# Patient Record
Sex: Female | Born: 1970 | Race: Black or African American | Hispanic: No | Marital: Married | Smoking: Never smoker
Health system: Southern US, Community
[De-identification: ages and names within clinical notes are randomized; demographics above are authoritative.]

## PROBLEM LIST (undated history)

## (undated) DIAGNOSIS — D219 Benign neoplasm of connective and other soft tissue, unspecified: Secondary | ICD-10-CM

## (undated) DIAGNOSIS — R519 Headache, unspecified: Secondary | ICD-10-CM

## (undated) DIAGNOSIS — D649 Anemia, unspecified: Secondary | ICD-10-CM

## (undated) DIAGNOSIS — N946 Dysmenorrhea, unspecified: Secondary | ICD-10-CM

## (undated) DIAGNOSIS — R51 Headache: Secondary | ICD-10-CM

## (undated) DIAGNOSIS — J40 Bronchitis, not specified as acute or chronic: Secondary | ICD-10-CM

## (undated) HISTORY — PX: CERVICAL CERCLAGE: SHX1329

---

## 1999-07-11 ENCOUNTER — Other Ambulatory Visit: Admission: RE | Admit: 1999-07-11 | Discharge: 1999-07-11 | Payer: Self-pay | Admitting: Obstetrics & Gynecology

## 1999-08-23 ENCOUNTER — Observation Stay (HOSPITAL_COMMUNITY): Admission: RE | Admit: 1999-08-23 | Discharge: 1999-08-23 | Payer: Self-pay | Admitting: Obstetrics & Gynecology

## 2000-01-23 ENCOUNTER — Inpatient Hospital Stay (HOSPITAL_COMMUNITY): Admission: AD | Admit: 2000-01-23 | Discharge: 2000-01-23 | Payer: Self-pay | Admitting: Obstetrics and Gynecology

## 2000-01-24 ENCOUNTER — Inpatient Hospital Stay (HOSPITAL_COMMUNITY): Admission: AD | Admit: 2000-01-24 | Discharge: 2000-01-26 | Payer: Self-pay | Admitting: Obstetrics & Gynecology

## 2000-12-12 ENCOUNTER — Ambulatory Visit (HOSPITAL_COMMUNITY): Admission: RE | Admit: 2000-12-12 | Discharge: 2000-12-12 | Payer: Self-pay | Admitting: Obstetrics & Gynecology

## 2001-10-21 ENCOUNTER — Encounter: Admission: RE | Admit: 2001-10-21 | Discharge: 2001-10-21 | Payer: Self-pay | Admitting: *Deleted

## 2002-06-02 ENCOUNTER — Emergency Department (HOSPITAL_COMMUNITY): Admission: EM | Admit: 2002-06-02 | Discharge: 2002-06-02 | Payer: Self-pay | Admitting: Emergency Medicine

## 2006-09-17 DIAGNOSIS — J40 Bronchitis, not specified as acute or chronic: Secondary | ICD-10-CM

## 2006-09-17 HISTORY — DX: Bronchitis, not specified as acute or chronic: J40

## 2012-08-22 ENCOUNTER — Encounter (HOSPITAL_BASED_OUTPATIENT_CLINIC_OR_DEPARTMENT_OTHER): Payer: Self-pay | Admitting: Emergency Medicine

## 2012-08-22 ENCOUNTER — Emergency Department (HOSPITAL_BASED_OUTPATIENT_CLINIC_OR_DEPARTMENT_OTHER)
Admission: EM | Admit: 2012-08-22 | Discharge: 2012-08-22 | Disposition: A | Discharge status: Home / Self Care | Payer: BC Managed Care – PPO | Attending: Emergency Medicine | Admitting: Emergency Medicine

## 2012-08-22 ENCOUNTER — Emergency Department (HOSPITAL_BASED_OUTPATIENT_CLINIC_OR_DEPARTMENT_OTHER): Payer: BC Managed Care – PPO

## 2012-08-22 DIAGNOSIS — J069 Acute upper respiratory infection, unspecified: Secondary | ICD-10-CM | POA: Insufficient documentation

## 2012-08-22 DIAGNOSIS — Z79899 Other long term (current) drug therapy: Secondary | ICD-10-CM | POA: Insufficient documentation

## 2012-08-22 MED ORDER — ALBUTEROL SULFATE HFA 108 (90 BASE) MCG/ACT IN AERS
2.0000 | INHALATION_SPRAY | Freq: Once | RESPIRATORY_TRACT | Status: AC
  Administered 2012-08-22: 2 via RESPIRATORY_TRACT
  Filled 2012-08-22: qty 6.7

## 2012-08-22 NOTE — ED Provider Notes (Signed)
History     CSN: [id number]  Arrival date & time 08/22/12  4098   First MD Initiated Contact with Patient 08/22/12 0348      Chief Complaint  Patient presents with  . Cough    (Consider location/radiation/quality/duration/timing/severity/associated sxs/prior treatment) HPI This 41 year old female has a few days of a nonproductive cough, she did have nasal congestion as well but the nasal congestion is now resolved, she is no sore throat, she is no fever, she is no chest pain, she is not short of breath but she is not able to sleep tonight due to the cough, she has no abdominal pain, she is no vomiting diarrhea rash, she was seen at an urgent care within the last couple days and diagnosed with a viral upper respiratory infection, there is no treatment prior to arrival. Her cough is moderately severe at times. History reviewed. No pertinent past medical history.  History reviewed. No pertinent past surgical history.  History reviewed. No pertinent family history.  History  Substance Use Topics  . Smoking status: Never Smoker   . Smokeless tobacco: Not on file  . Alcohol Use: No    OB History    Grav Para Term Preterm Abortions TAB SAB Ect Mult Living                  Review of Systems 10 Systems reviewed and are negative for acute change except as noted in the HPI. Allergies  Sulfa antibiotics  Home Medications   Current Outpatient Rx  Name  Route  Sig  Dispense  Refill  . IBUPROFEN 200 MG PO TABS   Oral   Take 200 mg by mouth every 6 (six) hours as needed.         Marland Kitchen PHENYLEPHRINE HCL 10 MG PO TABS   Oral   Take 10 mg by mouth every 4 (four) hours as needed.           BP 114/59  Pulse 74  Temp 98.6 F (37 C) (Oral)  Resp 16  SpO2 99%  LMP 08/11/2012  Physical Exam  Nursing note and vitals reviewed. Constitutional:       Awake, alert, nontoxic appearance.  HENT:  Head: Atraumatic.  Eyes: Right eye exhibits no discharge. Left eye exhibits no  discharge.  Neck: Neck supple.  Cardiovascular: Normal rate and regular rhythm.   No murmur heard. Pulmonary/Chest: Effort normal and breath sounds normal. No respiratory distress. She has no wheezes. She has no rales. She exhibits no tenderness.  Abdominal: Soft. Bowel sounds are normal. She exhibits no distension and no mass. There is no tenderness. There is no rebound and no guarding.  Musculoskeletal: She exhibits no tenderness.       Baseline ROM, no obvious new focal weakness.  Neurological: She is alert.       Mental status and motor strength appears baseline for patient and situation.  Skin: No rash noted.  Psychiatric: She has a normal mood and affect.    ED Course  Procedures (including critical care time)  Labs Reviewed - No data to display Dg Chest 2 View  08/22/2012  *RADIOLOGY REPORT*  Clinical Data: Cough and congestion.  CHEST - 2 VIEW  Comparison: None  Findings: The lungs are well-aerated and clear.  There is no evidence of focal opacification, pleural effusion or pneumothorax.  The heart is normal in size; the mediastinal contour is within normal limits.  No acute osseous abnormalities are seen.  IMPRESSION: No acute cardiopulmonary  process seen.   Original Report Authenticated By: Tonia Ghent, M.D.      1. URI (upper respiratory infection)       MDM  Patient / Family / Caregiver informed of clinical course, understand medical decision-making process, and agree with plan.  I doubt any other EMC precluding discharge at this time including, but not necessarily limited to the following:SBI.           Hurman Horn, MD 08/22/12 [phone]

## 2012-08-22 NOTE — ED Notes (Signed)
Pt recently dx with upper respiratory infection at urgent care tues, pt developed severe cough yesterday, unable to sleep,

## 2015-12-06 ENCOUNTER — Emergency Department (HOSPITAL_BASED_OUTPATIENT_CLINIC_OR_DEPARTMENT_OTHER): Payer: BLUE CROSS/BLUE SHIELD

## 2015-12-06 ENCOUNTER — Emergency Department (HOSPITAL_BASED_OUTPATIENT_CLINIC_OR_DEPARTMENT_OTHER)
Admission: EM | Admit: 2015-12-06 | Discharge: 2015-12-06 | Disposition: A | Discharge status: Home / Self Care | Payer: BLUE CROSS/BLUE SHIELD | Attending: Emergency Medicine | Admitting: Emergency Medicine

## 2015-12-06 ENCOUNTER — Encounter (HOSPITAL_BASED_OUTPATIENT_CLINIC_OR_DEPARTMENT_OTHER): Payer: Self-pay | Admitting: *Deleted

## 2015-12-06 DIAGNOSIS — Z8742 Personal history of other diseases of the female genital tract: Secondary | ICD-10-CM | POA: Insufficient documentation

## 2015-12-06 DIAGNOSIS — R1031 Right lower quadrant pain: Secondary | ICD-10-CM | POA: Insufficient documentation

## 2015-12-06 DIAGNOSIS — Z86018 Personal history of other benign neoplasm: Secondary | ICD-10-CM | POA: Diagnosis not present

## 2015-12-06 DIAGNOSIS — D509 Iron deficiency anemia, unspecified: Secondary | ICD-10-CM | POA: Diagnosis not present

## 2015-12-06 DIAGNOSIS — R197 Diarrhea, unspecified: Secondary | ICD-10-CM | POA: Diagnosis not present

## 2015-12-06 DIAGNOSIS — Z3202 Encounter for pregnancy test, result negative: Secondary | ICD-10-CM | POA: Insufficient documentation

## 2015-12-06 DIAGNOSIS — R109 Unspecified abdominal pain: Secondary | ICD-10-CM | POA: Diagnosis present

## 2015-12-06 HISTORY — DX: Anemia, unspecified: D64.9

## 2015-12-06 HISTORY — DX: Benign neoplasm of connective and other soft tissue, unspecified: D21.9

## 2015-12-06 HISTORY — DX: Dysmenorrhea, unspecified: N94.6

## 2015-12-06 LAB — URINALYSIS, ROUTINE W REFLEX MICROSCOPIC
Bilirubin Urine: NEGATIVE
Bilirubin Urine: NEGATIVE
GLUCOSE, UA: NEGATIVE mg/dL
Glucose, UA: NEGATIVE mg/dL
KETONES UR: NEGATIVE mg/dL
Ketones, ur: NEGATIVE mg/dL
Nitrite: NEGATIVE
Nitrite: NEGATIVE
PROTEIN: 100 mg/dL — AB
PROTEIN: NEGATIVE mg/dL
SPECIFIC GRAVITY, URINE: 1.003 — AB (ref 1.005–1.030)
Specific Gravity, Urine: 1.037 — ABNORMAL HIGH (ref 1.005–1.030)
pH: 5 (ref 5.0–8.0)
pH: 6 (ref 5.0–8.0)

## 2015-12-06 LAB — DIFFERENTIAL
BASOS ABS: 0 10*3/uL (ref 0.0–0.1)
BASOS PCT: 0 %
EOS PCT: 3 %
Eosinophils Absolute: 0.4 10*3/uL (ref 0.0–0.7)
LYMPHS ABS: 1 10*3/uL (ref 0.7–4.0)
Lymphocytes Relative: 7 %
MONO ABS: 0.4 10*3/uL (ref 0.1–1.0)
Monocytes Relative: 3 %
NEUTROS ABS: 11.3 10*3/uL — AB (ref 1.7–7.7)
Neutrophils Relative %: 87 %

## 2015-12-06 LAB — LIPASE, BLOOD: LIPASE: 37 U/L (ref 11–51)

## 2015-12-06 LAB — COMPREHENSIVE METABOLIC PANEL
ALK PHOS: 52 U/L (ref 38–126)
ALT: 13 U/L — AB (ref 14–54)
AST: 22 U/L (ref 15–41)
Albumin: 3.9 g/dL (ref 3.5–5.0)
Anion gap: 8 (ref 5–15)
BILIRUBIN TOTAL: 0.5 mg/dL (ref 0.3–1.2)
BUN: 15 mg/dL (ref 6–20)
CALCIUM: 9 mg/dL (ref 8.9–10.3)
CO2: 21 mmol/L — ABNORMAL LOW (ref 22–32)
CREATININE: 0.63 mg/dL (ref 0.44–1.00)
Chloride: 106 mmol/L (ref 101–111)
Glucose, Bld: 131 mg/dL — ABNORMAL HIGH (ref 65–99)
Potassium: 3.7 mmol/L (ref 3.5–5.1)
Sodium: 135 mmol/L (ref 135–145)
TOTAL PROTEIN: 7.1 g/dL (ref 6.5–8.1)

## 2015-12-06 LAB — CBC
HCT: 27 % — ABNORMAL LOW (ref 36.0–46.0)
Hemoglobin: 7.7 g/dL — ABNORMAL LOW (ref 12.0–15.0)
MCH: 19.8 pg — ABNORMAL LOW (ref 26.0–34.0)
MCHC: 28.5 g/dL — ABNORMAL LOW (ref 30.0–36.0)
MCV: 69.4 fL — AB (ref 78.0–100.0)
PLATELETS: 371 10*3/uL (ref 150–400)
RBC: 3.89 MIL/uL (ref 3.87–5.11)
RDW: 17.8 % — AB (ref 11.5–15.5)
WBC: 12.5 10*3/uL — AB (ref 4.0–10.5)

## 2015-12-06 LAB — URINE MICROSCOPIC-ADD ON

## 2015-12-06 LAB — PREGNANCY, URINE: PREG TEST UR: NEGATIVE

## 2015-12-06 MED ORDER — FERROUS SULFATE 325 (65 FE) MG PO TABS: 325.0000 mg | ORAL_TABLET | Freq: Three times a day (TID) | ORAL | Status: AC

## 2015-12-06 MED ORDER — SODIUM CHLORIDE 0.9 % IV BOLUS (SEPSIS)
1000.0000 mL | Freq: Once | INTRAVENOUS | Status: AC
  Administered 2015-12-06: 1000 mL via INTRAVENOUS

## 2015-12-06 MED ORDER — ONDANSETRON HCL 4 MG/2ML IJ SOLN
4.0000 mg | Freq: Once | INTRAMUSCULAR | Status: AC
  Administered 2015-12-06: 4 mg via INTRAVENOUS
  Filled 2015-12-06: qty 2

## 2015-12-06 MED ORDER — IOHEXOL 300 MG/ML  SOLN
100.0000 mL | Freq: Once | INTRAMUSCULAR | Status: AC | PRN
  Administered 2015-12-06: 100 mL via INTRAVENOUS

## 2015-12-06 MED ORDER — FENTANYL CITRATE (PF) 100 MCG/2ML IJ SOLN
100.0000 ug | Freq: Once | INTRAMUSCULAR | Status: AC
  Administered 2015-12-06: 100 ug via INTRAVENOUS
  Filled 2015-12-06: qty 2

## 2015-12-06 NOTE — ED Provider Notes (Signed)
CSN: [id number]     Arrival date & time 12/06/15  0112 History   First MD Initiated Contact with Patient 12/06/15 [phone]     Chief Complaint  Patient presents with  . Abdominal Pain     (Consider location/radiation/quality/duration/timing/severity/associated sxs/prior Treatment) HPI  This is a 45 year old female who developed right lower quadrant abdominal pain yesterday. She describes the pain as sharp. It is worse with movement or palpation. She rates it as an 8 out of 10. She is also currently on her menses and is having diarrhea. Diarrhea often accompanies her menses. She had nausea earlier but none now. She is having menstrual cramps consistent with previous dysmenorrhea. She is afebrile.  Past Medical History  Diagnosis Date  . Fibroid tumor   . Dysmenorrhea   . Anemia    History reviewed. No pertinent past surgical history. History reviewed. No pertinent family history. Social History  Substance Use Topics  . Smoking status: Never Smoker   . Smokeless tobacco: None  . Alcohol Use: No   OB History    No data available     Review of Systems  All other systems reviewed and are negative.   Allergies  Sulfa antibiotics  Home Medications   Prior to Admission medications   Medication Sig Start Date End Date Taking? Authorizing Provider  ibuprofen (ADVIL,MOTRIN) 200 MG tablet Take 200 mg by mouth every 6 (six) hours as needed.    Historical Provider, MD  phenylephrine (SUDAFED PE) 10 MG TABS Take 10 mg by mouth every 4 (four) hours as needed.    Historical Provider, MD   BP 121/58 mmHg  Pulse 74  Temp(Src) 97.7 F (36.5 C) (Oral)  Resp 18  Ht 5' 3.25" (1.607 m)  Wt 204 lb (92.534 kg)  BMI 35.83 kg/m2  SpO2 98%  LMP 12/06/2015   Physical Exam General: Well-developed, well-nourished female in no acute distress; appearance consistent with age of record HENT: normocephalic; atraumatic Eyes: pupils equal, round and reactive to light; extraocular muscles  intact Neck: supple Heart: regular rate and rhythm Lungs: clear to auscultation bilaterally Abdomen: soft; nondistended; right lower quadrant tenderness; no masses or hepatosplenomegaly; bowel sounds present Extremities: No deformity; full range of motion; pulses normal Neurologic: Awake, alert and oriented; motor function intact in all extremities and symmetric; no facial droop Skin: Warm and dry Psychiatric: Normal mood and affect    ED Course  Procedures (including critical care time)   MDM   Nursing notes and vitals signs, including pulse oximetry, reviewed.  Summary of this visit's results, reviewed by myself:  Labs:  Results for orders placed or performed during the hospital encounter of 12/06/15 (from the past 24 hour(s))  Lipase, blood     Status: None   Collection Time: 12/06/15  1:45 AM  Result Value Ref Range   Lipase 37 11 - 51 U/L  Comprehensive metabolic panel     Status: Abnormal   Collection Time: 12/06/15  1:45 AM  Result Value Ref Range   Sodium 135 135 - 145 mmol/L   Potassium 3.7 3.5 - 5.1 mmol/L   Chloride 106 101 - 111 mmol/L   CO2 21 (L) 22 - 32 mmol/L   Glucose, Bld 131 (H) 65 - 99 mg/dL   BUN 15 6 - 20 mg/dL   Creatinine, Ser 0.63 0.44 - 1.00 mg/dL   Calcium 9.0 8.9 - 10.3 mg/dL   Total Protein 7.1 6.5 - 8.1 g/dL   Albumin 3.9 3.5 - 5.0 g/dL  AST 22 15 - 41 U/L   ALT 13 (L) 14 - 54 U/L   Alkaline Phosphatase 52 38 - 126 U/L   Total Bilirubin 0.5 0.3 - 1.2 mg/dL   GFR calc non Af Amer >60 >60 mL/min   GFR calc Af Amer >60 >60 mL/min   Anion gap 8 5 - 15  CBC     Status: Abnormal   Collection Time: 12/06/15  1:45 AM  Result Value Ref Range   WBC 12.5 (H) 4.0 - 10.5 K/uL   RBC 3.89 3.87 - 5.11 MIL/uL   Hemoglobin 7.7 (L) 12.0 - 15.0 g/dL   HCT 27.0 (L) 36.0 - 46.0 %   MCV 69.4 (L) 78.0 - 100.0 fL   MCH 19.8 (L) 26.0 - 34.0 pg   MCHC 28.5 (L) 30.0 - 36.0 g/dL   RDW 17.8 (H) 11.5 - 15.5 %   Platelets 371 150 - 400 K/uL  Differential      Status: Abnormal   Collection Time: 12/06/15  1:45 AM  Result Value Ref Range   Neutro Abs 11.3 (H) 1.7 - 7.7 K/uL   Lymphs Abs 1.0 0.7 - 4.0 K/uL   Monocytes Absolute 0.4 0.1 - 1.0 K/uL   Eosinophils Absolute 0.4 0.0 - 0.7 K/uL   Basophils Absolute 0.0 0.0 - 0.1 K/uL   Neutrophils Relative % 87 %   Lymphocytes Relative 7 %   Monocytes Relative 3 %   Eosinophils Relative 3 %   Basophils Relative 0 %   RBC Morphology ELLIPTOCYTES    Smear Review LARGE PLATELETS PRESENT   Urinalysis, Routine w reflex microscopic (not at [facility])     Status: Abnormal   Collection Time: 12/06/15  2:20 AM  Result Value Ref Range   Color, Urine RED (A) YELLOW   APPearance CLOUDY (A) CLEAR   Specific Gravity, Urine 1.037 (H) 1.005 - 1.030   pH 5.0 5.0 - 8.0   Glucose, UA NEGATIVE NEGATIVE mg/dL   Hgb urine dipstick LARGE (A) NEGATIVE   Bilirubin Urine NEGATIVE NEGATIVE   Ketones, ur NEGATIVE NEGATIVE mg/dL   Protein, ur 100 (A) NEGATIVE mg/dL   Nitrite NEGATIVE NEGATIVE   Leukocytes, UA SMALL (A) NEGATIVE  Pregnancy, urine     Status: None   Collection Time: 12/06/15  2:20 AM  Result Value Ref Range   Preg Test, Ur NEGATIVE NEGATIVE  Urine microscopic-add on     Status: Abnormal   Collection Time: 12/06/15  2:20 AM  Result Value Ref Range   Squamous Epithelial / LPF 0-5 (A) NONE SEEN   WBC, UA 6-30 0 - 5 WBC/hpf   RBC / HPF TOO NUMEROUS TO COUNT 0 - 5 RBC/hpf   Bacteria, UA FEW (A) NONE SEEN   Urine-Other URINALYSIS PERFORMED ON SUPERNATANT   Urinalysis, Routine w reflex microscopic (not at [facility])     Status: Abnormal   Collection Time: 12/06/15  3:50 AM  Result Value Ref Range   Color, Urine YELLOW YELLOW   APPearance CLEAR CLEAR   Specific Gravity, Urine 1.003 (L) 1.005 - 1.030   pH 6.0 5.0 - 8.0   Glucose, UA NEGATIVE NEGATIVE mg/dL   Hgb urine dipstick LARGE (A) NEGATIVE   Bilirubin Urine NEGATIVE NEGATIVE   Ketones, ur NEGATIVE NEGATIVE mg/dL   Protein, ur NEGATIVE NEGATIVE mg/dL    Nitrite NEGATIVE NEGATIVE   Leukocytes, UA MODERATE (A) NEGATIVE  Urine microscopic-add on     Status: Abnormal   Collection Time: 12/06/15  3:50  AM  Result Value Ref Range   Squamous Epithelial / LPF 0-5 (A) NONE SEEN   WBC, UA 6-30 0 - 5 WBC/hpf   RBC / HPF 0-5 0 - 5 RBC/hpf   Bacteria, UA RARE (A) NONE SEEN    Imaging Studies: Ct Abdomen Pelvis W Contrast  12/06/2015  CLINICAL DATA:  Severe right lower quadrant pain of 6 hours duration. EXAM: CT ABDOMEN AND PELVIS WITH CONTRAST TECHNIQUE: Multidetector CT imaging of the abdomen and pelvis was performed using the standard protocol following bolus administration of intravenous contrast. CONTRAST:  187mL OMNIPAQUE IOHEXOL 300 MG/ML  SOLN COMPARISON:  None. FINDINGS: Lower chest:  No significant abnormality Hepatobiliary: There are normal appearances of the liver, gallbladder and bile ducts. Pancreas: Normal Spleen: Normal Adrenals/Urinary Tract: There are lower pole collecting system calculi bilaterally, 2 mm. Otherwise normal kidneys. Ureters and urinary bladder are unremarkable. Adrenals are normal. Stomach/Bowel: There are normal appearances of the stomach, small bowel and colon. The appendix is normal. Vascular/Lymphatic: The abdominal aorta is normal in caliber. There is no atherosclerotic calcification. There is no adenopathy in the abdomen or pelvis. Reproductive: Marked enlargement of the uterus with multiple fibroids measuring up to 6 cm. Uterine dimensions are 7.8 x 10.6 x 14.1 cm. Other: No acute inflammatory changes are evident in the abdomen or pelvis. There is no ascites. Musculoskeletal: No significant skeletal lesions. IMPRESSION: 1. No acute findings are evident in the abdomen or pelvis. Normal appendix. 2. Enlarged multi fibroid uterus. 3. Bilateral nephrolithiasis. Electronically Signed   By: Andreas Newport M.D.   On: 12/06/2015 05:45   Patient advised to CT findings. Urine sent for culture.    Shanon Rosser, MD 12/06/15 [phone]

## 2015-12-06 NOTE — ED Notes (Signed)
Alice in to talk with pt about drinking contrast.

## 2015-12-06 NOTE — ED Notes (Signed)
Returned from CT scan.

## 2015-12-06 NOTE — ED Notes (Signed)
Pt states she has had diarrhea all day. On menses now, "but not menses pain". Also c/o RLQ pain. Describes as sharp.

## 2015-12-06 NOTE — Discharge Instructions (Signed)

## 2015-12-06 NOTE — ED Notes (Signed)
Pt given d/c instructions as per chart. Rx x 1. Verbalizes understanding. No questions. 

## 2015-12-06 NOTE — ED Notes (Signed)
Continuing to drink contrast.

## 2015-12-07 LAB — URINE CULTURE

## 2017-01-01 IMAGING — CT CT ABD-PELV W/ CM
2 of 5 series · 16 of 46 positions shown, 18 images · IV contrast (APPLIED)
Comparison: None.

CLINICAL DATA: Severe right lower quadrant pain of 6 hours
duration.

EXAM:
CT ABDOMEN AND PELVIS WITH CONTRAST
TECHNIQUE: Multidetector CT imaging of the abdomen and pelvis was performed
using the standard protocol following bolus administration of
intravenous contrast.
CONTRAST:  100mL OMNIPAQUE IOHEXOL 300 MG/ML  SOLN

[Series 2: axial st · axial · 0.87mm/px · z∈[-492,-72]mm · 13 of 94 slices shown, 15 images]
[im 5/94  soft-tissue]
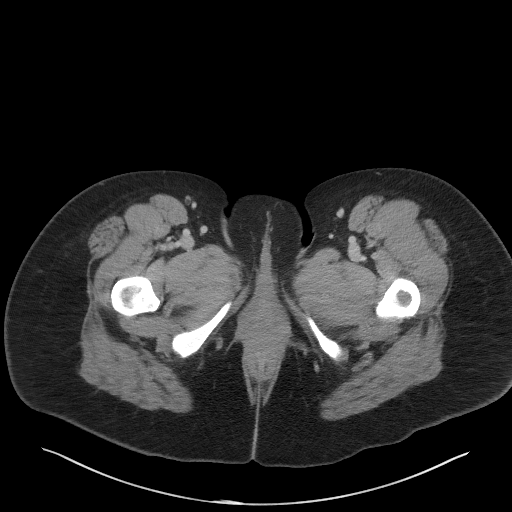
[im 5/94  bone]
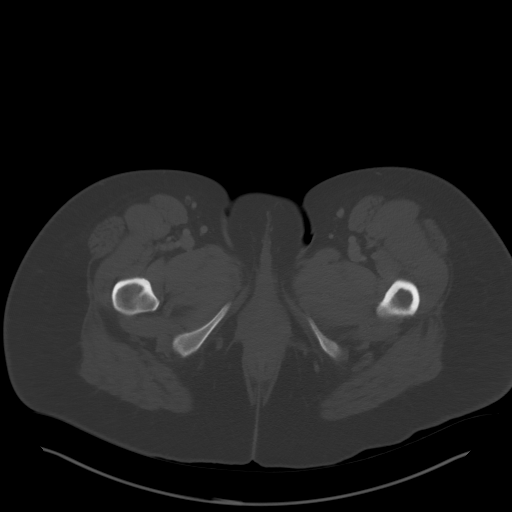
[im 15/94  soft-tissue]
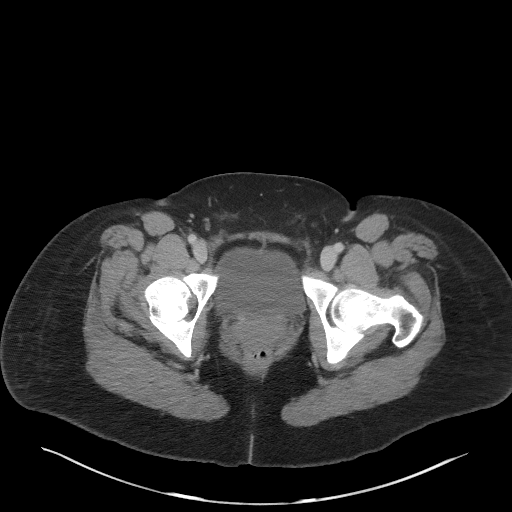
[im 20/94  soft-tissue]
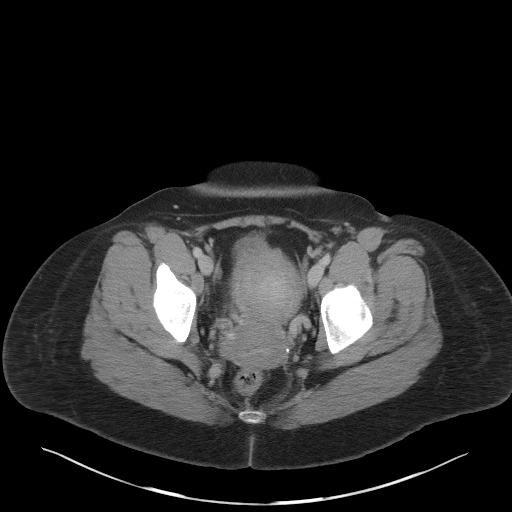
[im 25/94  soft-tissue]
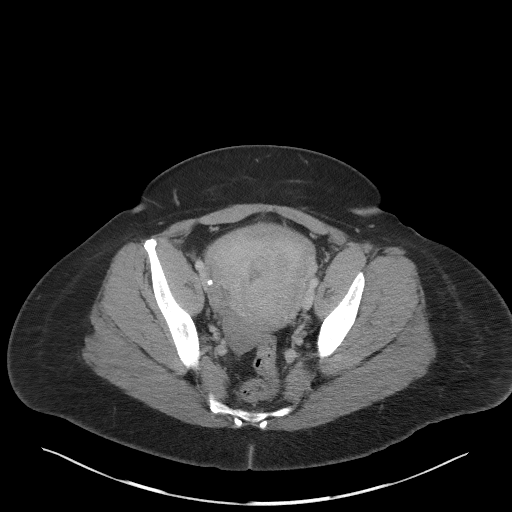
[im 35/94  soft-tissue]
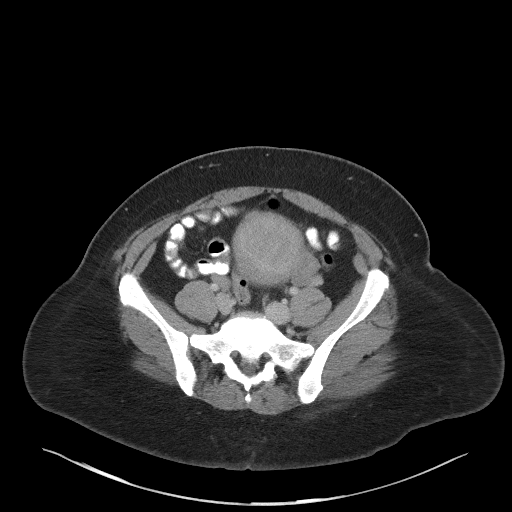
[im 40/94  soft-tissue]
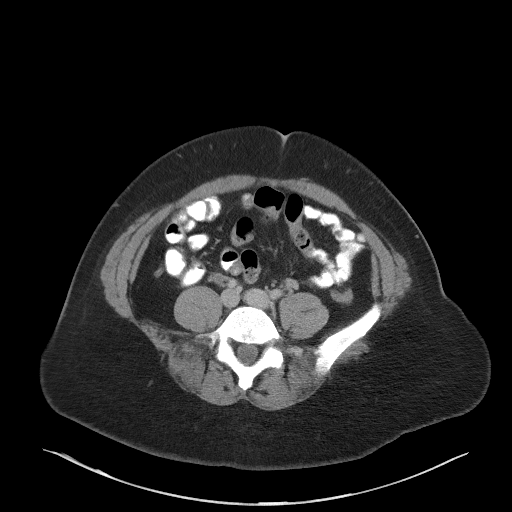
[im 49/94  soft-tissue]
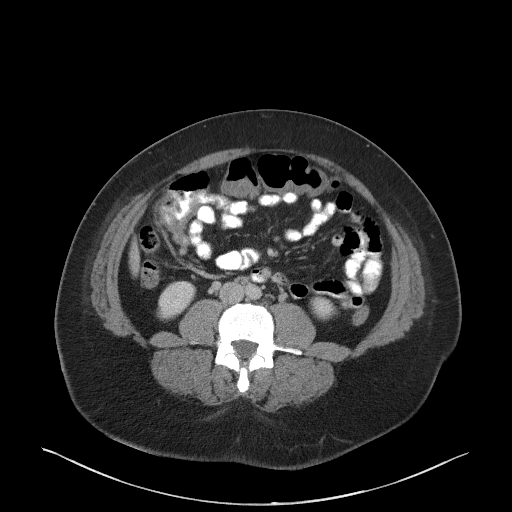
[im 54/94  soft-tissue]
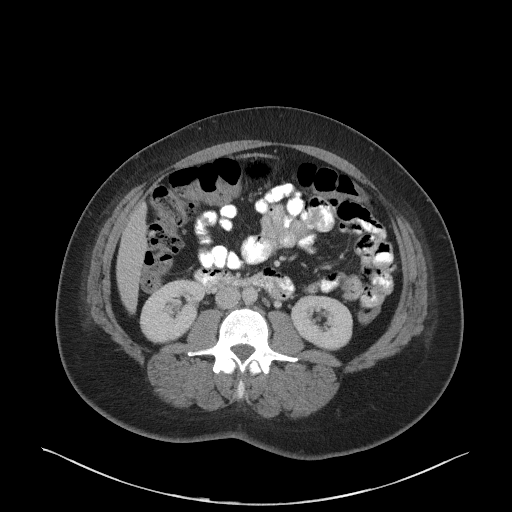
[im 59/94  soft-tissue]
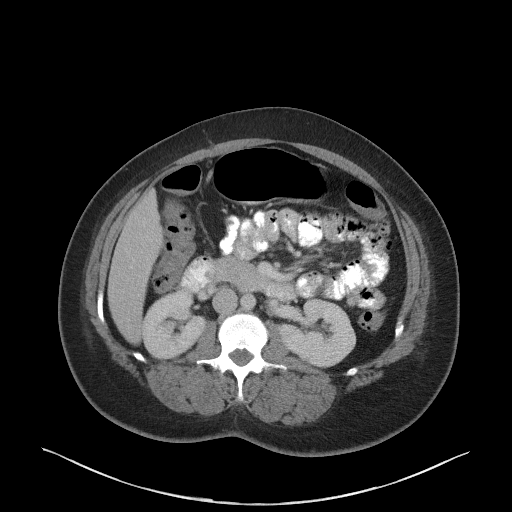
[im 59/94  bone]
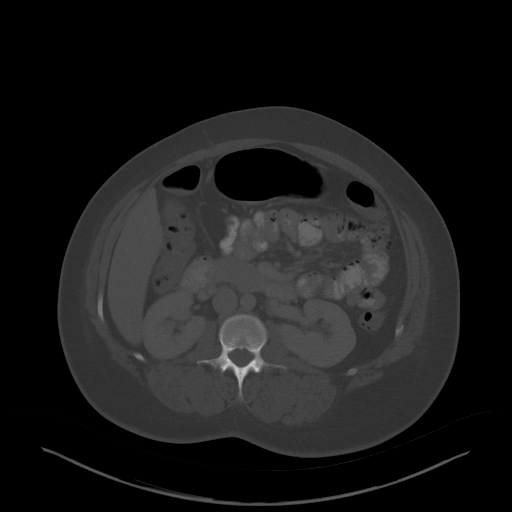
[im 69/94  soft-tissue]
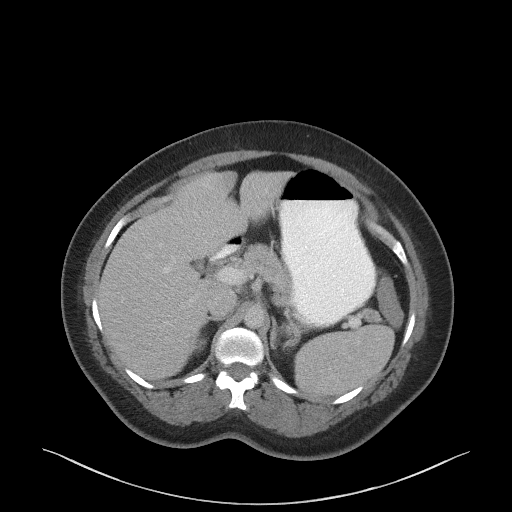
[im 74/94  soft-tissue]
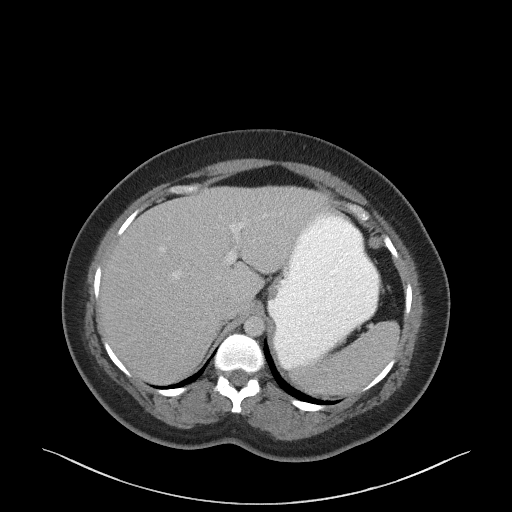
[im 79/94  soft-tissue]
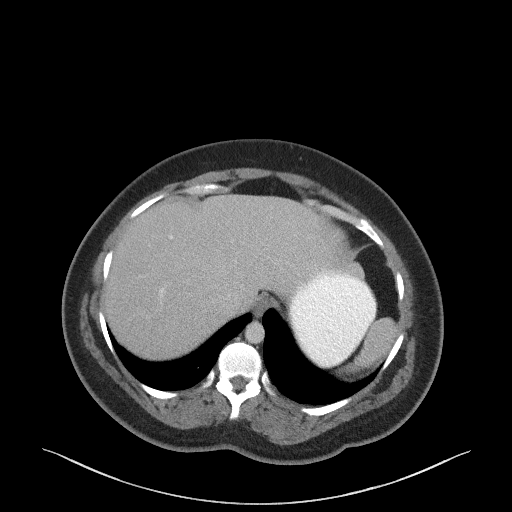
[im 89/94  soft-tissue]
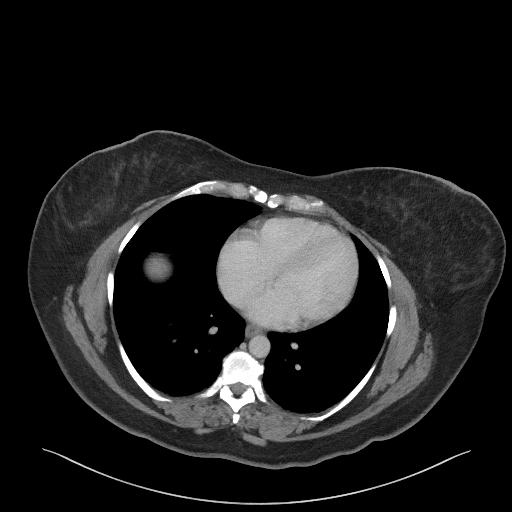

[Series 5: coronal st · coronal · 0.76mm/px · 3 of 83 slices shown]
[im 28/83  soft-tissue]
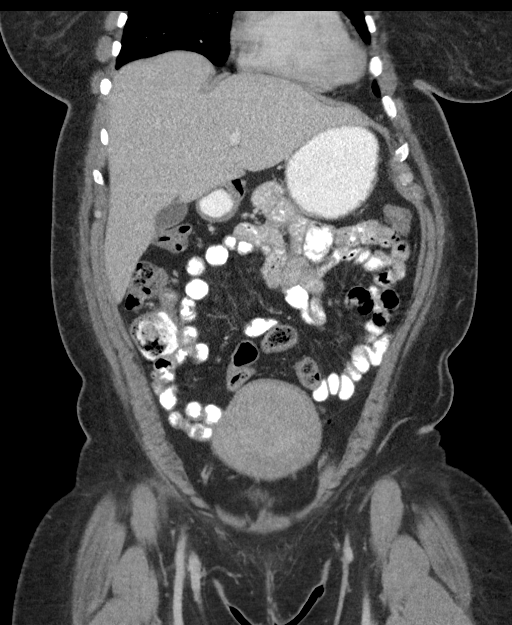
[im 37/83  soft-tissue]
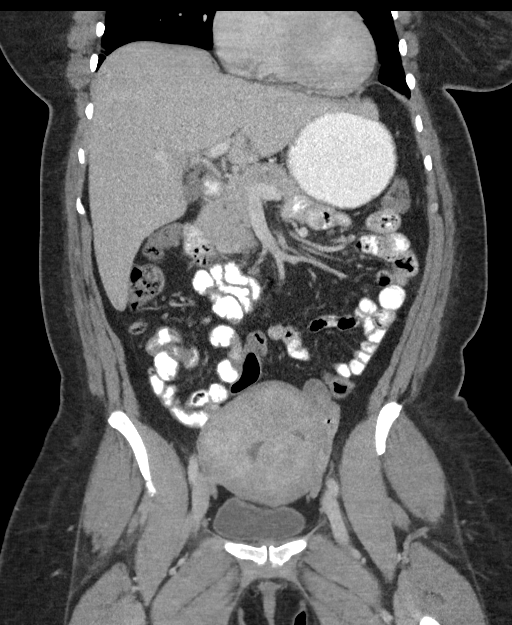
[im 46/83  soft-tissue]
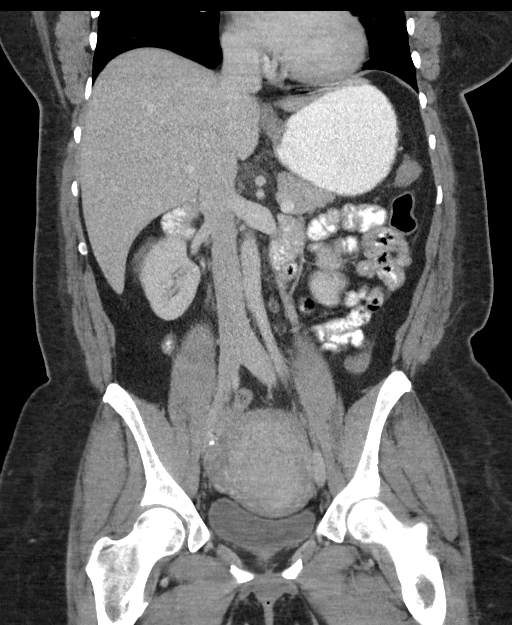

[16 of 46 positions shown; findings below may reference images not displayed]

FINDINGS: Lower chest:  No significant abnormality

Hepatobiliary: There are normal appearances of the liver,
gallbladder and bile ducts.

Pancreas: Normal

Spleen: Normal

Adrenals/Urinary Tract: There are lower pole collecting system
calculi bilaterally, 2 mm. Otherwise normal kidneys. Ureters and
urinary bladder are unremarkable. Adrenals are normal.

Stomach/Bowel: There are normal appearances of the stomach, small
bowel and colon. The appendix is normal.

Vascular/Lymphatic: The abdominal aorta is normal in caliber. There
is no atherosclerotic calcification. There is no adenopathy in the
abdomen or pelvis.

Reproductive: Marked enlargement of the uterus with multiple
fibroids measuring up to 6 cm. Uterine dimensions are 7.8 x 10.6 x
14.1 cm.

Other: No acute inflammatory changes are evident in the abdomen or
pelvis. There is no ascites.

Musculoskeletal: No significant skeletal lesions.
IMPRESSION: 1. No acute findings are evident in the abdomen or pelvis. Normal
appendix.
2. Enlarged multi fibroid uterus.
3. Bilateral nephrolithiasis.

## 2017-03-11 ENCOUNTER — Encounter: Payer: Self-pay | Admitting: Obstetrics & Gynecology

## 2017-03-11 ENCOUNTER — Ambulatory Visit (INDEPENDENT_AMBULATORY_CARE_PROVIDER_SITE_OTHER): Payer: BLUE CROSS/BLUE SHIELD | Admitting: Obstetrics & Gynecology

## 2017-03-11 VITALS — BP 128/76 | Ht 63.5 in | Wt 202.0 lb

## 2017-03-11 DIAGNOSIS — N920 Excessive and frequent menstruation with regular cycle: Secondary | ICD-10-CM | POA: Diagnosis not present

## 2017-03-11 DIAGNOSIS — D259 Leiomyoma of uterus, unspecified: Secondary | ICD-10-CM | POA: Diagnosis not present

## 2017-03-11 DIAGNOSIS — Z01411 Encounter for gynecological examination (general) (routine) with abnormal findings: Secondary | ICD-10-CM | POA: Diagnosis not present

## 2017-03-11 DIAGNOSIS — D500 Iron deficiency anemia secondary to blood loss (chronic): Secondary | ICD-10-CM

## 2017-03-11 DIAGNOSIS — Z9851 Tubal ligation status: Secondary | ICD-10-CM

## 2017-03-11 DIAGNOSIS — Z1151 Encounter for screening for human papillomavirus (HPV): Secondary | ICD-10-CM

## 2017-03-11 LAB — CBC
HCT: 29.8 % — ABNORMAL LOW (ref 35.0–45.0)
HEMOGLOBIN: 8.7 g/dL — AB (ref 11.7–15.5)
MCH: 21.3 pg — ABNORMAL LOW (ref 27.0–33.0)
MCHC: 29.2 g/dL — AB (ref 32.0–36.0)
MCV: 72.9 fL — ABNORMAL LOW (ref 80.0–100.0)
MPV: 9 fL (ref 7.5–12.5)
PLATELETS: 442 10*3/uL — AB (ref 140–400)
RBC: 4.09 MIL/uL (ref 3.80–5.10)
RDW: 22.6 % — ABNORMAL HIGH (ref 11.0–15.0)
WBC: 8 10*3/uL (ref 3.8–10.8)

## 2017-03-11 LAB — TSH: TSH: 0.99 m[IU]/L

## 2017-03-11 MED ORDER — NORETHIN ACE-ETH ESTRAD-FE 1-20 MG-MCG(24) PO TABS: 1.0000 | ORAL_TABLET | Freq: Every day | ORAL | 4 refills | Status: DC

## 2017-03-11 NOTE — Addendum Note (Signed)
Addended by: Thurnell Garbe A on: 03/11/2017 03:35 PM   Modules accepted: Orders

## 2017-03-11 NOTE — Patient Instructions (Signed)
1. Encounter for gynecological examination with abnormal finding Abnormal Gyn exam with large nodular uterus c/w Fibroids.  Pap/HPV done.  Breasts wnl.  Will schedule Screening mammo.  2. Tubal ligation status   3. Menorrhagia with regular cycle Probably secondary to large Fibroids.  H/O severe anemia on FeSO4, recent blood transfusion.  R/O current anemia.  R/O Hypothyroidism.  F/U Pelvic US to measure and locate Fibroids.  Will decide on Medical with DepoLupron vs Surgical treatment based on findings.  Start low BCPs continuous use to prevent menses in the meantime. - CBC - TSH - US Transvaginal Non-OB; Future  4. Uterine leiomyoma, unspecified location F/U Pelvic US. - CBC - US Transvaginal Non-OB; Future  5. Iron deficiency anemia due to chronic blood loss As above. - CBC - TSH - US Transvaginal Non-OB; Future  Barbara Rivera, it was a pleasure to meet you today!  I will inform you of your results as soon as available.  See you soon at the time of your Pelvic US.  Leuprolide depot injection What is this medicine? LEUPROLIDE (loo PROE lide) is a man-made protein that acts like a natural hormone in the body. It decreases testosterone in men and decreases estrogen in women. In men, this medicine is used to treat advanced prostate cancer. In women, some forms of this medicine may be used to treat endometriosis, uterine fibroids, or other female hormone-related problems. This medicine may be used for other purposes; ask your health care provider or pharmacist if you have questions. COMMON BRAND NAME(S): Eligard, Lupron Depot, Lupron Depot-Ped, Viadur What should I tell my health care provider before I take this medicine? They need to know if you have any of these conditions: -diabetes -heart disease or previous heart attack -high blood pressure -high cholesterol -mental illness -osteoporosis -pain or difficulty passing urine -seizures -spinal cord metastasis -stroke -suicidal  thoughts, plans, or attempt; a previous suicide attempt by you or a family member -tobacco smoker -unusual vaginal bleeding (women) -an unusual or allergic reaction to leuprolide, benzyl alcohol, other medicines, foods, dyes, or preservatives -pregnant or trying to get pregnant -breast-feeding How should I use this medicine? This medicine is for injection into a muscle or for injection under the skin. It is given by a health care professional in a hospital or clinic setting. The specific product will determine how it will be given to you. Make sure you understand which product you receive and how often you will receive it. Talk to your pediatrician regarding the use of this medicine in children. Special care may be needed. Overdosage: If you think you have taken too much of this medicine contact a poison control center or emergency room at once. NOTE: This medicine is only for you. Do not share this medicine with others. What if I miss a dose? It is important not to miss a dose. Call your doctor or health care professional if you are unable to keep an appointment. Depot injections: Depot injections are given either once-monthly, every 12 weeks, every 16 weeks, or every 24 weeks depending on the product you are prescribed. The product you are prescribed will be based on if you are female or female, and your condition. Make sure you understand your product and dosing. What may interact with this medicine? Do not take this medicine with any of the following medications: -chasteberry This medicine may also interact with the following medications: -herbal or dietary supplements, like black cohosh or DHEA -female hormones, like estrogens or progestins and birth control  pills, patches, rings, or injections -female hormones, like testosterone This list may not describe all possible interactions. Give your health care provider a list of all the medicines, herbs, non-prescription drugs, or dietary supplements  you use. Also tell them if you smoke, drink alcohol, or use illegal drugs. Some items may interact with your medicine. What should I watch for while using this medicine? Visit your doctor or health care professional for regular checks on your progress. During the first weeks of treatment, your symptoms may get worse, but then will improve as you continue your treatment. You may get hot flashes, increased bone pain, increased difficulty passing urine, or an aggravation of nerve symptoms. Discuss these effects with your doctor or health care professional, some of them may improve with continued use of this medicine. Female patients may experience a menstrual cycle or spotting during the first months of therapy with this medicine. If this continues, contact your doctor or health care professional. What side effects may I notice from receiving this medicine? Side effects that you should report to your doctor or health care professional as soon as possible: -allergic reactions like skin rash, itching or hives, swelling of the face, lips, or tongue -breathing problems -chest pain -depression or memory disorders -pain in your legs or groin -pain at site where injected or implanted -seizures -severe headache -swelling of the feet and legs -suicidal thoughts or other mood changes -visual changes -vomiting Side effects that usually do not require medical attention (report to your doctor or health care professional if they continue or are bothersome): -breast swelling or tenderness -decrease in sex drive or performance -diarrhea -hot flashes -loss of appetite -muscle, joint, or bone pains -nausea -redness or irritation at site where injected or implanted -skin problems or acne This list may not describe all possible side effects. Call your doctor for medical advice about side effects. You may report side effects to FDA at 1-800-FDA-1088. Where should I keep my medicine? This drug is given in a  hospital or clinic and will not be stored at home. NOTE: This sheet is a summary. It may not cover all possible information. If you have questions about this medicine, talk to your doctor, pharmacist, or health care provider.  2018 [employer]vier/Gold Standard (2016-02-16 09:45:53)

## 2017-03-11 NOTE — Progress Notes (Signed)
FARYN SIEG 1971-06-04 [id number]   History:    46 y.o.  G3P3L3 Married.  S/P BT/S.  RP:  New patient presenting for annual gyn exam and Sxic Uterine Fibroids  HPI:  Regular menses qmonth, but very heavy flow.  Had a CT scan in 2017 showing a Uterine largest diameter of 14 cm d/t fibroids.  Her Hb was 7.7 at that time.  More recently, patient presented for Rt LQ Pain.  Had a Pelvic US showing Fibroids again, largest per patient was >6 cm.  Hb at 6.  She had a Blood transfusion May 2018 and is on FeSO4.  Not on any hormonal treatment to control her menstrual cycle.  Breasts wnl.  Mictions/BMs wnl.  Past medical history,surgical history, family history and social history were all reviewed and documented in the EPIC chart.  Gynecologic History Patient's last menstrual period was 03/06/2017. Contraception: tubal ligation Last Pap: >10 yrs ago.  Result:  Normal per patient Last mammogram: 2016. Results were: normal per patient.  Obstetric History OB History  Gravida Para Term Preterm AB Living  3 3       3   SAB TAB Ectopic Multiple Live Births               # Outcome Date GA Lbr Len/2nd Weight Sex Delivery Anes PTL Lv  3 Para           2 Para           1 Para                ROS: A ROS was performed and pertinent positives and negatives are included in the history.  GENERAL: No fevers or chills. HEENT: No change in vision, no earache, sore throat or sinus congestion. NECK: No pain or stiffness. CARDIOVASCULAR: No chest pain or pressure. No palpitations. PULMONARY: No shortness of breath, cough or wheeze. GASTROINTESTINAL: No abdominal pain, nausea, vomiting or diarrhea, melena or bright red blood per rectum. GENITOURINARY: No urinary frequency, urgency, hesitancy or dysuria. MUSCULOSKELETAL: No joint or muscle pain, no back pain, no recent trauma. DERMATOLOGIC: No rash, no itching, no lesions. ENDOCRINE: No polyuria, polydipsia, no heat or cold intolerance. No recent change in  weight. HEMATOLOGICAL: No anemia or easy bruising or bleeding. NEUROLOGIC: No headache, seizures, numbness, tingling or weakness. PSYCHIATRIC: No depression, no loss of interest in normal activity or change in sleep pattern.     Exam:   BP 128/76   Ht 5' 3.5" (1.613 m)   Wt 202 lb (91.6 kg)   LMP 03/06/2017   BMI 35.22 kg/m   Body mass index is 35.22 kg/m.  General appearance : Well developed well nourished female. No acute distress HEENT: Eyes: no retinal hemorrhage or exudates,  Neck supple, trachea midline, no carotid bruits, no thyroidmegaly Lungs: Clear to auscultation, no rhonchi or wheezes, or rib retractions  Heart: Regular rate and rhythm, no murmurs or gallops Breast:Examined in sitting and supine position were symmetrical in appearance, no palpable masses or tenderness,  no skin retraction, no nipple inversion, no nipple discharge, no skin discoloration, no axillary or supraclavicular lymphadenopathy Abdomen: no palpable masses or tenderness, no rebound or guarding Extremities: no edema or skin discoloration or tenderness  Pelvic:  Bartholin, Urethra, Skene Glands: Within normal limits             Vagina: No gross lesions or discharge  Cervix: No gross lesions or discharge.  Pap/HPV done.  Uterus  AV, nodular, about  20 cm (just below umbilicus), mobile  Adnexa  Without masses or tenderness  Anus and perineum  normal     Assessment/Plan:  46 y.o. female for annual exam   1. Encounter for gynecological examination with abnormal finding Abnormal Gyn exam with large nodular uterus c/w Fibroids.  Pap/HPV done.  Breasts wnl.  Will schedule Screening mammo.  2. Tubal ligation status   3. Menorrhagia with regular cycle Probably secondary to large Fibroids.  H/O severe anemia on FeSO4, recent blood transfusion.  R/O current anemia.  R/O Hypothyroidism.  F/U Pelvic US to measure and locate Fibroids.  Will decide on Medical with DepoLupron vs Surgical treatment based on  findings.  Start low BCPs continuous use to prevent menses in the meantime. - CBC - TSH - US Transvaginal Non-OB; Future  4. Uterine leiomyoma, unspecified location F/U Pelvic US. - CBC - US Transvaginal Non-OB; Future  5. Iron deficiency anemia due to chronic blood loss As above. - CBC - TSH - US Transvaginal Non-OB; Future  Counseling on above issues >50% x 20 minutes.  Princess Bruins MD, 2:45 PM 03/11/2017

## 2017-03-14 LAB — PAP, TP IMAGING W/ HPV RNA, RFLX HPV TYPE 16,18/45: HPV mRNA, High Risk: NOT DETECTED

## 2017-03-25 ENCOUNTER — Ambulatory Visit (INDEPENDENT_AMBULATORY_CARE_PROVIDER_SITE_OTHER): Payer: BLUE CROSS/BLUE SHIELD | Admitting: Obstetrics & Gynecology

## 2017-03-25 ENCOUNTER — Ambulatory Visit (INDEPENDENT_AMBULATORY_CARE_PROVIDER_SITE_OTHER): Payer: BLUE CROSS/BLUE SHIELD

## 2017-03-25 ENCOUNTER — Other Ambulatory Visit: Payer: Self-pay | Admitting: Obstetrics & Gynecology

## 2017-03-25 VITALS — BP 124/78

## 2017-03-25 DIAGNOSIS — D251 Intramural leiomyoma of uterus: Secondary | ICD-10-CM | POA: Diagnosis not present

## 2017-03-25 DIAGNOSIS — D500 Iron deficiency anemia secondary to blood loss (chronic): Secondary | ICD-10-CM | POA: Diagnosis not present

## 2017-03-25 DIAGNOSIS — D252 Subserosal leiomyoma of uterus: Secondary | ICD-10-CM

## 2017-03-25 DIAGNOSIS — D259 Leiomyoma of uterus, unspecified: Secondary | ICD-10-CM | POA: Diagnosis not present

## 2017-03-25 DIAGNOSIS — N920 Excessive and frequent menstruation with regular cycle: Secondary | ICD-10-CM

## 2017-03-25 DIAGNOSIS — D250 Submucous leiomyoma of uterus: Secondary | ICD-10-CM

## 2017-03-25 DIAGNOSIS — D649 Anemia, unspecified: Secondary | ICD-10-CM | POA: Diagnosis not present

## 2017-03-25 DIAGNOSIS — N83201 Unspecified ovarian cyst, right side: Secondary | ICD-10-CM

## 2017-03-25 DIAGNOSIS — N939 Abnormal uterine and vaginal bleeding, unspecified: Secondary | ICD-10-CM | POA: Diagnosis not present

## 2017-03-25 DIAGNOSIS — N852 Hypertrophy of uterus: Secondary | ICD-10-CM

## 2017-03-25 NOTE — Progress Notes (Signed)
    Barbara Rivera 27-Sep-1970 [id number]        46 y.o.  G3P3 Married.  S/P BT/S.  RP:  Severe Menorrhagia with Anemia with Fibroid Uterus for Pelvic US  Past medical history,surgical history, problem list, medications, allergies, family history and social history were all reviewed and documented in the EPIC chart.  Directed ROS with pertinent positives and negatives documented in the history of present illness/assessment and plan.  Exam:  There were no vitals filed for this visit. General appearance:  Normal  Pelvic US today:  T/V and T/A:  Anteverted Enlarged Uteurs aat 17.0 x 13.7 x 9.7 cm.  SM/IM Myoma 5.7 x 4.8 cm displacing the Endometrium.  IM Myoma 3.1 x 2.5 cm, 6.1 x 6.3 cm, 6.8 x 5.3 x 5.7 cm.  Endometrial line 11.9 mm with fluid 1.4 x 0.7 cm.  Right Ovary with thin-walled low level echo Cyst 2.5 x 2.0 cm.  Negative CFD.  Left Ovary normal.  No FF in CDS.  Assessment/Plan:  46 y.o. G3P3   1. Intramural, submucous, and subserous leiomyoma of uterus Given her age S/P BT/S and the large size of the Fibroids with severe Menorrhagia/Anemia necessitating Blood transfusion, decision to proceed with Hysterectomy.  Recommended Total Abdominal Hysterectomy, but preservation of her vaginal anatomy is very important to her.  Risks of continued bleeding, pain, development of fibroids in the remaining cervix and cervical cancer explained. Patient understands, but still desires a Supracervical Hysterectomy.  Decision therefore taken to proceed with a Supracervical Abdominal Hysterectomy with Bilateral Salpingectomy.  Recommend and patient agrees with preservation of Ovaries.  Surgery/Risks/Benefits thoroughly reviewed with patient.                        Patient was counseled as to the risk of surgery to include the following:  1. Infection (prohylactic antibiotics will be administered)  2. DVT/Pulmonary Embolism (prophylactic pneumo compression stockings will be used)  3.Trauma to internal  organs requiring additional surgical procedure to repair any injury to     Internal organs requiring perhaps additional hospitalization days.  4.Hemmorhage requiring transfusion and blood products which carry risks such as anaphylactic reaction, hepatitis and AIDS  Patient had received literature information on the procedure scheduled and all her questions were answered and fully accepts all risk.  2. Menorrhagia with regular cycle Probably secondary to large Uterine Myomas IM and SM.  3. Severe anemia Down to Hb of 6.0, had a Blood transfusion 01/2017.  Current Hb 8.7 on 03/11/2017.  Started un BCPs 6/25th to decrease periods until surgery.  Continue FeSO4 supplement.  Counseling on above issues and Pelvic US findings reviewed >50% x 15 minutes.   Princess Bruins MD, 12:02 PM 03/25/2017

## 2017-03-26 ENCOUNTER — Telehealth: Payer: Self-pay

## 2017-03-26 NOTE — Telephone Encounter (Signed)
Patient called me back.  We discussed her ins benefits and her estimated surgery prepymt amount. I will be checking with Sharrie Rothman regarding arrangements for that. Financial letter will be sent.  Scheduled her for 05/22/17 at 9:30am at [facility].

## 2017-03-26 NOTE — Telephone Encounter (Signed)
I called patient and left message and asked her to call me back to discuss scheduling surgery.

## 2017-03-30 NOTE — Patient Instructions (Addendum)
   1. Intramural, submucous, and subserous leiomyoma of uterus Given her age S/P BT/S and the large size of the Fibroids with severe Menorrhagia/Anemia necessitating Blood transfusion, decision to proceed with Hysterectomy.  Recommended Total Abdominal Hysterectomy, but preservation of her vaginal anatomy is very important to her.  Risks of continued bleeding, pain, development of fibroids in the remaining cervix and cervical cancer explained. Patient understands, but still desires a Supracervical Hysterectomy.  Decision therefore taken to proceed with a Supracervical Abdominal Hysterectomy with Bilateral Salpingectomy.  Recommend and patient agrees with preservation of Ovaries.  Surgery/Risks/Benefits thoroughly reviewed with patient.                        Patient was counseled as to the risk of surgery to include the following:  1. Infection (prohylactic antibiotics will be administered)  2. DVT/Pulmonary Embolism (prophylactic pneumo compression stockings will be used)  3.Trauma to internal organs requiring additional surgical procedure to repair any injury to     Internal organs requiring perhaps additional hospitalization days.  4.Hemmorhage requiring transfusion and blood products which carry risks such as anaphylactic reaction, hepatitis and AIDS  Patient had received literature information on the procedure scheduled and all her questions were answered and fully accepts all risk.  2. Menorrhagia with regular cycle Probably secondary to large Uterine Myomas IM and SM.  3. Severe anemia Down to Hb of 6.0, had a Blood transfusion 01/2017.  Current Hb 8.7 on 03/11/2017.  Started un BCPs 6/25th to decrease periods until surgery.  Continue FeSO4 supplement.  Endya, good to see you today!

## 2017-04-01 ENCOUNTER — Encounter: Payer: BLUE CROSS/BLUE SHIELD | Admitting: Obstetrics & Gynecology

## 2017-04-08 ENCOUNTER — Encounter: Payer: Self-pay | Admitting: Obstetrics & Gynecology

## 2017-04-08 ENCOUNTER — Telehealth: Payer: Self-pay | Admitting: *Deleted

## 2017-04-08 NOTE — Telephone Encounter (Signed)
Pt called c/o lots of cramping and bleeding x 9 days now, pt states she was told to take Loestrin Fe 24 continuously skipping placebo pills on pack 2 now, said bleeding started off light now increased to medium flow, taking OTC tylenol for cramps but no relief, scheduled for surgery on 05/22/17, cramps are constant. Pt said 1. Can she have a stronger Rx for cramps? 2. Do you want her to still continue birth control pill up until surgery or a different treatment? Please advise

## 2017-04-09 NOTE — Telephone Encounter (Signed)
I recommend Ibuprofen 800 mg PO q8 hours for cramps.  Take a 5 day break from [facility] and then restart until surgery.

## 2017-04-09 NOTE — Telephone Encounter (Signed)
Left message for pt to call.

## 2017-04-09 NOTE — Telephone Encounter (Signed)
Pt informed

## 2017-04-10 ENCOUNTER — Telehealth: Payer: Self-pay | Admitting: *Deleted

## 2017-04-10 NOTE — Telephone Encounter (Signed)
Pt mother called and left message in triage voicemail (mother doesn't have DPR access) asking me to call the patient stating pt was in pain and the bleeding is " out of control" I called pt on listed # in epic and recieved her voicemail, I left her a message to call me.

## 2017-04-11 ENCOUNTER — Inpatient Hospital Stay (HOSPITAL_COMMUNITY)
Admission: AD | Admit: 2017-04-11 | Discharge: 2017-04-11 | Disposition: A | Discharge status: Home / Self Care | Payer: BLUE CROSS/BLUE SHIELD | Source: Ambulatory Visit | Attending: Obstetrics & Gynecology | Admitting: Obstetrics & Gynecology

## 2017-04-11 ENCOUNTER — Encounter (HOSPITAL_COMMUNITY): Payer: Self-pay

## 2017-04-11 ENCOUNTER — Telehealth: Payer: Self-pay | Admitting: *Deleted

## 2017-04-11 DIAGNOSIS — R030 Elevated blood-pressure reading, without diagnosis of hypertension: Secondary | ICD-10-CM | POA: Insufficient documentation

## 2017-04-11 DIAGNOSIS — D259 Leiomyoma of uterus, unspecified: Secondary | ICD-10-CM | POA: Insufficient documentation

## 2017-04-11 DIAGNOSIS — Z882 Allergy status to sulfonamides status: Secondary | ICD-10-CM | POA: Insufficient documentation

## 2017-04-11 DIAGNOSIS — N946 Dysmenorrhea, unspecified: Secondary | ICD-10-CM | POA: Insufficient documentation

## 2017-04-11 DIAGNOSIS — R102 Pelvic and perineal pain: Secondary | ICD-10-CM | POA: Diagnosis present

## 2017-04-11 DIAGNOSIS — D500 Iron deficiency anemia secondary to blood loss (chronic): Secondary | ICD-10-CM | POA: Insufficient documentation

## 2017-04-11 DIAGNOSIS — N939 Abnormal uterine and vaginal bleeding, unspecified: Secondary | ICD-10-CM | POA: Diagnosis not present

## 2017-04-11 LAB — URINALYSIS, ROUTINE W REFLEX MICROSCOPIC
BACTERIA UA: NONE SEEN
BILIRUBIN URINE: NEGATIVE
GLUCOSE, UA: NEGATIVE mg/dL
KETONES UR: 20 mg/dL — AB
NITRITE: NEGATIVE
PH: 5 (ref 5.0–8.0)
PROTEIN: NEGATIVE mg/dL
Specific Gravity, Urine: 1.011 (ref 1.005–1.030)
Squamous Epithelial / LPF: NONE SEEN

## 2017-04-11 LAB — CBC
HCT: 28.4 % — ABNORMAL LOW (ref 36.0–46.0)
Hemoglobin: 8.5 g/dL — ABNORMAL LOW (ref 12.0–15.0)
MCH: 22 pg — AB (ref 26.0–34.0)
MCHC: 29.9 g/dL — AB (ref 30.0–36.0)
MCV: 73.4 fL — ABNORMAL LOW (ref 78.0–100.0)
PLATELETS: 308 10*3/uL (ref 150–400)
RBC: 3.87 MIL/uL (ref 3.87–5.11)
RDW: 19.5 % — ABNORMAL HIGH (ref 11.5–15.5)
WBC: 13.2 10*3/uL — ABNORMAL HIGH (ref 4.0–10.5)

## 2017-04-11 LAB — POCT PREGNANCY, URINE: PREG TEST UR: NEGATIVE

## 2017-04-11 MED ORDER — KETOROLAC TROMETHAMINE 10 MG PO TABS: 10.0000 mg | ORAL_TABLET | Freq: Four times a day (QID) | ORAL | 0 refills | Status: DC | PRN

## 2017-04-11 MED ORDER — KETOROLAC TROMETHAMINE 60 MG/2ML IM SOLN
60.0000 mg | Freq: Once | INTRAMUSCULAR | Status: AC
  Administered 2017-04-11: 60 mg via INTRAMUSCULAR
  Filled 2017-04-11: qty 2

## 2017-04-11 MED ORDER — HYDROMORPHONE HCL 2 MG PO TABS: 2.0000 mg | ORAL_TABLET | ORAL | 0 refills | Status: DC | PRN

## 2017-04-11 MED ORDER — HYDROMORPHONE HCL 1 MG/ML IJ SOLN: 1.0000 mg | Freq: Once | INTRAMUSCULAR | Status: DC

## 2017-04-11 MED ORDER — LACTATED RINGERS IV SOLN: INTRAVENOUS | Status: DC

## 2017-04-11 MED ORDER — HYDROMORPHONE HCL 2 MG/ML IJ SOLN
2.0000 mg | Freq: Once | INTRAMUSCULAR | Status: AC
  Administered 2017-04-11: 2 mg via INTRAMUSCULAR
  Filled 2017-04-11: qty 1

## 2017-04-11 MED ORDER — NORETHINDRONE ACET-ETHINYL EST 1.5-30 MG-MCG PO TABS: ORAL_TABLET | ORAL | 1 refills | Status: DC

## 2017-04-11 MED ORDER — NORETHINDRONE ACET-ETHINYL EST 1.5-30 MG-MCG PO TABS: 1.0000 | ORAL_TABLET | Freq: Every day | ORAL | 1 refills | Status: DC

## 2017-04-11 NOTE — Telephone Encounter (Signed)
Please start back on BCP, but change to Junel 1.5/30, please send a prescription.  Then continuous use until surgery.

## 2017-04-11 NOTE — MAU Note (Signed)
Pt.'s mother came to nurses desk demanding for her daughter to have surgery tonight due to patients pain. I spoke with Tamala Julian, CNM about her mothers concerns about daughters pain. Tamala Julian, CNM spoke with Dr. Dellis Filbert about plan of care.

## 2017-04-11 NOTE — Progress Notes (Signed)
Pt stated that she had thoughts of ending the pain, spoke with Gerarda Fraction, DO she said to reassess after administering pain medication.

## 2017-04-11 NOTE — MAU Provider Note (Signed)
Chief Complaint: Pelvic Pain   SUBJECTIVE HPI: Barbara Rivera is a 46 y.o. G3P3 who presents to MAU with fibroid pain. Patient states that she is being working up for uterine fibroids. She has h/o sever menorrhagia, pelvic pain, and anemia from fibroids. Surgery is scheduled for 05/22/2017. Patient states that she started bleeding on July 14 and is has not stopped. She endorses heaving bleeding for several weeks with associated clotting. She is soaking about 2 pads per hour. She rates her pain a 10/10 in severity. It is causing her to have pain with walking and moving. She has tried tramadol, muscle relaxer, and ibuprofen. Nothing is helping. She is also taking OCPs to help with bleeding. Called office today and was prescribed stronger OCP.    Past Medical History:  Diagnosis Date  . Anemia   . Dysmenorrhea   . Fibroid tumor    OB History  Gravida Para Term Preterm AB Living  3 3       3   SAB TAB Ectopic Multiple Live Births               # Outcome Date GA Lbr Len/2nd Weight Sex Delivery Anes PTL Lv  3 Para           2 Para           1 Para              Past Surgical History:  Procedure Laterality Date  . CERVICAL CERCLAGE    . CESAREAN SECTION     Social History   Social History  . Marital status: Married    Spouse name: N/A  . Number of children: N/A  . Years of education: N/A   Occupational History  . Not on file.   Social History Main Topics  . Smoking status: Never Smoker  . Smokeless tobacco: Never Used  . Alcohol use Yes     Comment: OCC  . Drug use: No  . Sexual activity: Yes    Partners: Male    Birth control/ protection: None     Comment: 1st intercourse- 17, partners- 1   Other Topics Concern  . Not on file   Social History Narrative  . No narrative on file   No current facility-administered medications on file prior to encounter.    Current Outpatient Prescriptions on File Prior to Encounter  Medication Sig Dispense Refill  . acetaminophen  (TYLENOL) 500 MG tablet Take 500 mg by mouth every 6 (six) hours as needed.    . ferrous sulfate 325 (65 FE) MG tablet Take 1 tablet (325 mg total) by mouth 3 (three) times daily with meals. 90 tablet 0  . Norethindrone Acetate-Ethinyl Estradiol (JUNEL,LOESTRIN,MICROGESTIN) 1.5-30 MG-MCG tablet Take 1 tablet by mouth daily. 3 Package 1  . [DISCONTINUED] Norethindrone Acetate-Ethinyl Estrad-FE (LOESTRIN 24 FE) 1-20 MG-MCG(24) tablet Take 1 tablet by mouth daily. Continuous use for menorrhagia 3 Package 4   Allergies  Allergen Reactions  . Betadine [Povidone Iodine]   . Sulfa Antibiotics     I have reviewed the past Medical Hx, Surgical Hx, Social Hx, Allergies and Medications.   REVIEW OF SYSTEMS All systems reviewed and are negative for acute change except as noted in the HPI.   OBJECTIVE Patient Vitals for the past 24 hrs:  BP Temp Temp src Pulse Resp SpO2 Height Weight  04/11/17 2302 127/68 - - - - - - -  04/11/17 2216 (!) 142/58 - - 66 - - - -  04/11/17 2201 (!) 141/67 - - 75 - - - -  04/11/17 2132 (!) 174/55 - - 68 - - - -  04/11/17 2116 (!) 172/77 - - 76 - - - -  04/11/17 2101 (!) 170/78 - - 69 - - - -  04/11/17 2046 (!) 160/85 - - 68 - - - -  04/11/17 2031 (!) 168/128 - - 65 - - - -  04/11/17 2016 (!) 162/52 - - 75 - - - -  04/11/17 1934 (!) 169/77 97.9 F (36.6 C) Oral 62 19 100 % 5' 3.5" (1.613 m) 204 lb (92.5 kg)    PHYSICAL EXAM Constitutional: Well-developed, well-nourished female in severe distress.  Cardiovascular: normal rate Respiratory: normal rate and effort.  GI: Abd soft, tender, non-distended. No guarding. 20 week-size uterus.  MS: Extremities nontender, no edema, normal ROM Neurologic: Alert and oriented x 4. No focal deficits.   LAB RESULTS Results for orders placed or performed during the hospital encounter of 04/11/17 (from the past 24 hour(s))  CBC     Status: Abnormal   Collection Time: 04/11/17  7:54 PM  Result Value Ref Range   WBC 13.2 (H)  4.0 - 10.5 K/uL   RBC 3.87 3.87 - 5.11 MIL/uL   Hemoglobin 8.5 (L) 12.0 - 15.0 g/dL   HCT 28.4 (L) 36.0 - 46.0 %   MCV 73.4 (L) 78.0 - 100.0 fL   MCH 22.0 (L) 26.0 - 34.0 pg   MCHC 29.9 (L) 30.0 - 36.0 g/dL   RDW 19.5 (H) 11.5 - 15.5 %   Platelets 308 150 - 400 K/uL  Urinalysis, Routine w reflex microscopic     Status: Abnormal   Collection Time: 04/11/17  9:45 PM  Result Value Ref Range   Color, Urine YELLOW YELLOW   APPearance CLEAR CLEAR   Specific Gravity, Urine 1.011 1.005 - 1.030   pH 5.0 5.0 - 8.0   Glucose, UA NEGATIVE NEGATIVE mg/dL   Hgb urine dipstick LARGE (A) NEGATIVE   Bilirubin Urine NEGATIVE NEGATIVE   Ketones, ur 20 (A) NEGATIVE mg/dL   Protein, ur NEGATIVE NEGATIVE mg/dL   Nitrite NEGATIVE NEGATIVE   Leukocytes, UA SMALL (A) NEGATIVE   RBC / HPF TOO NUMEROUS TO COUNT 0 - 5 RBC/hpf   WBC, UA 6-30 0 - 5 WBC/hpf   Bacteria, UA NONE SEEN NONE SEEN   Squamous Epithelial / LPF NONE SEEN NONE SEEN   Mucous PRESENT    Hyaline Casts, UA PRESENT   Pregnancy, urine POC     Status: None   Collection Time: 04/11/17 10:02 PM  Result Value Ref Range   Preg Test, Ur NEGATIVE NEGATIVE    IMAGING US Transvaginal Non-ob  Result Date: 03/30/2017 T/V and T/A:  Anteverted Enlarged Uteurs aat 17.0 x 13.7 x 9.7 cm.  SM/IM Myoma 5.7 x 4.8 cm displacing the Endometrium.  IM Myoma 3.1 x 2.5 cm, 6.1 x 6.3 cm, 6.8 x 5.3 x 5.7 cm.  Endometrial line 11.9 mm with fluid 1.4 x 0.7 cm.  Right Ovary with thin-walled low level echo Cyst 2.5 x 2.0 cm.  Negative CFD.  Left Ovary normal.  No FF in CDS.   US Pelvis Complete  Result Date: 03/30/2017 T/V and T/A:  Anteverted Enlarged Uteurs aat 17.0 x 13.7 x 9.7 cm.  SM/IM Myoma 5.7 x 4.8 cm displacing the Endometrium.  IM Myoma 3.1 x 2.5 cm, 6.1 x 6.3 cm, 6.8 x 5.3 x 5.7 cm.  Endometrial line 11.9 mm  with fluid 1.4 x 0.7 cm.  Right Ovary with thin-walled low level echo Cyst 2.5 x 2.0 cm.  Negative CFD.  Left Ovary normal.  No FF in CDS.    MAU  COURSE Will check CBC for anemia -?tranfusion IV pain medication Vitals are stable  Will discuss with Dr. Dellis Filbert when results come back  Luiz Blare, DO [facility], [place] 04/11/2017, 7:39 PM  Manya Silvas, CNM assumed care of pt at 2000. RN unable to get IV access. Requesting Dilaudid be changed to IM->changed.  Pt's mother again demanding that Dr. Dellis Filbert perform hysterectomy tonight due to patient's pain and heavy bleeding. Discussed Hx, exam, labs, pt's mother's demand w/ Dr. Dellis Filbert, who asked CNM to explain to family that her surgery will require two surgeons and that an emergent surgery is not necessary. Will treat bleeding more aggressively with higher-dose OCP--may take 2 tabs per day x 3 days to stop bleeding faster. Will Tx pain more aggressively with Dilaudid 2 mg PO Q4-6.   Bleeding small-mod amount. Pt's mother is very concerned that pain and heavy bleeding will return and is still concerned about patient going home tonight and having to wait for surgery. After lengthy conversation w/ pt, mother and husband all are comfortable w/ POC. Questions addressed.  BP have be elevated, but pt has been lying on cuff and in great pain. Denies HA, chest pain, SOB. Pt states BP is usually 120/70's. (no HTN per reviewed of Epic records.) Pain improving w/ Dilaudid, but pt still moaning when cramps come. Pt turned to semi-fowlers. Toradol added. Will reassess BP and discuss w/ Dr. Dellis Filbert.  BP now 140/70's. Pain 1-2/10. Bleeding small amount. Ready for D/C.   BP 127/68. Pulses 60-70's.   MDM 1. Uterine leiomyoma, unspecified location   2. Abnormal uterine bleeding--stable. From fibroid. Will Tx w/ increasing OCPs for now. Dr Dellis Filbert will work on moving surgery sooner for definitive management.   3. Severe dysmenorrhea-Chronic, but much worse since bleeding had increased. Only occurs w/ heavy bleeding and passing clots. May be from large volume of  bleeding and /or degenerating fibroid. Low concern for infection.    4. Chronic blood loss anemia--Stable. Tx w/ OCP, PO Iron.     ASSESSMENT 1. Uterine leiomyoma, unspecified location   2. Abnormal uterine bleeding   3. Severe dysmenorrhea   4. Chronic blood loss anemia   5. Transient hypertension    PLAN D/C home in stable condition her consult w/ Dr/ Dellis Filbert Bleeding and HTN precautions.  Follow-up Information    Princess Bruins, MD Follow up on 04/12/2017.   Specialty:  Obstetrics and Gynecology Why:  at 8:00 am Contact information: [place] [id number] [phone]        [facility] Follow up.   Why:  as needed in gynecology emergencies Contact information: [address] [phone] [place] [phone]         Allergies as of 04/11/2017      Reactions   Betadine [povidone Iodine] Other (See Comments)   "eats off a layer of skin"   Sulfa Antibiotics Rash      Medication List    STOP taking these medications   ibuprofen 200 MG tablet Commonly known as:  ADVIL,MOTRIN   orphenadrine 100 MG tablet Commonly known as:  NORFLEX   traMADol 50 MG tablet Commonly known as:  Veatrice Bourbon  TAKE these medications   ferrous sulfate 325 (65 FE) MG tablet Take 1 tablet (325 mg total) by mouth 3 (three) times daily with meals.   HYDROmorphone 2 MG tablet Commonly known as:  DILAUDID Take 1 tablet (2 mg total) by mouth every 4 (four) hours as needed for severe pain.   ketorolac 10 MG tablet Commonly known as:  TORADOL Take 1 tablet (10 mg total) by mouth every 6 (six) hours as needed.   Norethindrone Acetate-Ethinyl Estradiol 1.5-30 MG-MCG tablet Commonly known as:  JUNEL,LOESTRIN,MICROGESTIN Take two pills per day for 3 days, then one pill per day. What changed:  how much to take  how to take this  when to take this  additional instructions        Tamala Julian, [place], [place] 04/12/2017 1:32 AM

## 2017-04-11 NOTE — Discharge Instructions (Signed)
Your next Dilaudid tablet at due to 11:30 pm, then every 4 hours for the next 24 hours. Next Dose of Toradol is due at 3:30 am, then every 6 hours. Take with food.   Dysmenorrhea Menstrual cramps (dysmenorrhea) are caused by the muscles of the uterus tightening (contracting) during a menstrual period. For some women, this discomfort is merely bothersome. For others, dysmenorrhea can be severe enough to interfere with everyday activities for a few days each month. Primary dysmenorrhea is menstrual cramps that last a couple of days when you start having menstrual periods or soon after. This often begins after a teenager starts having her period. As a woman gets older or has a baby, the cramps will usually lessen or disappear. Secondary dysmenorrhea begins later in life, lasts longer, and the pain may be stronger than primary dysmenorrhea. The pain may start before the period and last a few days after the period. What are the causes? Dysmenorrhea is usually caused by an underlying problem, such as:  The tissue lining the uterus grows outside of the uterus in other areas of the body (endometriosis).  The endometrial tissue, which normally lines the uterus, is found in or grows into the muscular walls of the uterus (adenomyosis).  The pelvic blood vessels are engorged with blood just before the menstrual period (pelvic congestive syndrome).  Overgrowth of cells (polyps) in the lining of the uterus or cervix.  Falling down of the uterus (prolapse) because of loose or stretched ligaments.  Depression.  Bladder problems, infection, or inflammation.  Problems with the intestine, a tumor, or irritable bowel syndrome.  Cancer of the female organs or bladder.  A severely tipped uterus.  A very tight opening or closed cervix.  Noncancerous tumors of the uterus (fibroids).  Pelvic inflammatory disease (PID).  Pelvic scarring (adhesions) from a previous surgery.  Ovarian cyst.  An  intrauterine device (IUD) used for birth control.  What increases the risk? You may be at greater risk of dysmenorrhea if:  You are younger than age 86.  You started puberty early.  You have irregular or heavy bleeding.  You have never given birth.  You have a family history of this problem.  You are a smoker.  What are the signs or symptoms?  Cramping or throbbing pain in your lower abdomen.  Headaches.  Lower back pain.  Nausea or vomiting.  Diarrhea.  Sweating or dizziness.  Loose stools. How is this diagnosed? A diagnosis is based on your history, symptoms, physical exam, diagnostic tests, or procedures. Diagnostic tests or procedures may include:  Blood tests.  Ultrasonography.  An examination of the lining of the uterus (dilation and curettage, D&C).  An examination inside your abdomen or pelvis with a scope (laparoscopy).  X-rays.  CT scan.  MRI.  An examination inside the bladder with a scope (cystoscopy).  An examination inside the intestine or stomach with a scope (colonoscopy, gastroscopy).  How is this treated? Treatment depends on the cause of the dysmenorrhea. Treatment may include:  Pain medicine prescribed by your health care provider.  Birth control pills or an IUD with progesterone hormone in it.  Hormone replacement therapy.  Nonsteroidal anti-inflammatory drugs (NSAIDs). These may help stop the production of prostaglandins.  Surgery to remove adhesions, endometriosis, ovarian cyst, or fibroids.  Removal of the uterus (hysterectomy).  Progesterone shots to stop the menstrual period.  Cutting the nerves on the sacrum that go to the female organs (presacral neurectomy).  Electric current to the sacral nerves (  sacral nerve stimulation).  Antidepressant medicine.  Psychiatric therapy, counseling, or group therapy.  Exercise and physical therapy.  Meditation and yoga therapy.  Acupuncture.  Follow these instructions at  home:  Only take over-the-counter or prescription medicines as directed by your health care provider.  Place a heating pad or hot water bottle on your lower back or abdomen. Do not sleep with the heating pad.  Use aerobic exercises, walking, swimming, biking, and other exercises to help lessen the cramping.  Massage to the lower back or abdomen may help.  Stop smoking.  Avoid alcohol and caffeine. Contact a health care provider if:  Your pain does not get better with medicine.  You have pain with sexual intercourse.  Your pain increases and is not controlled with medicines.  You have abnormal vaginal bleeding with your period.  You develop nausea or vomiting with your period that is not controlled with medicine. Get help right away if: You pass out. This information is not intended to replace advice given to you by your health care provider. Make sure you discuss any questions you have with your health care provider. Document Released: 09/03/2005 Document Revised: 02/09/2016 Document Reviewed: 02/19/2013 Elsevier Interactive Patient Education  2017 Elsevier Inc.   Abnormal Uterine Bleeding Abnormal uterine bleeding can affect women at various stages in life, including teenagers, women in their reproductive years, pregnant women, and women who have reached menopause. Several kinds of uterine bleeding are considered abnormal, including:  Bleeding or spotting between periods.  Bleeding after sexual intercourse.  Bleeding that is heavier or more than normal.  Periods that last longer than usual.  Bleeding after menopause.  Many cases of abnormal uterine bleeding are minor and simple to treat, while others are more serious. Any type of abnormal bleeding should be evaluated by your health care provider. Treatment will depend on the cause of the bleeding. Follow these instructions at home: Monitor your condition for any changes. The following actions may help to alleviate any  discomfort you are experiencing:  Avoid the use of tampons and douches as directed by your health care provider.  Change your pads frequently.  You should get regular pelvic exams and Pap tests. Keep all follow-up appointments for diagnostic tests as directed by your health care provider. Contact a health care provider if:  Your bleeding lasts more than 1 week.  You feel dizzy at times. Get help right away if:  You pass out.  You are changing pads every 15 to 30 minutes.  You have abdominal pain.  You have a fever.  You become sweaty or weak.  You are passing large blood clots from the vagina.  You start to feel nauseous and vomit. This information is not intended to replace advice given to you by your health care provider. Make sure you discuss any questions you have with your health care provider. Document Released: 09/03/2005 Document Revised: 02/15/2016 Document Reviewed: 04/02/2013 Elsevier Interactive Patient Education  2017 [employer].

## 2017-04-11 NOTE — [facility] Note (Signed)
Spoke with Pt with provider at bedside Tamala Julian, CNM) about thoughts of harming self. Pt. States "that she has no plan and no suicidal thoughts." Provider discussed outside resources for help. Pt agrees that she will seek help if she has suicidal thoughts. Pt is comfortable and resting.

## 2017-04-11 NOTE — Telephone Encounter (Signed)
Pt scheduled for surgery on 05/22/17 for abdominal hysterectomy called to follow up telephone encounter on 04/08/17, has stopped birth control pills as directed, using ibuprofen 800 mg q 8 hours. Pt said the bleeding has increased since stopping pills on 04/09/17 using pads saturating 2 pads in 1 hour, went [facility] yesterday due to pain/bleeding, states they gave her a shot of Toradol, ultram Rx, and a muscle relaxer Rx and she is using the ibuprofen as well and still no relief. I advised pt to schedule OV now, but pt declined states she can't come, states she doesn't have anyone to drive her and she just can't come in today. Pt asked me to check with you to see if you could prescribed something to stop the heavy bleeding, states this will stop the pain, which is sharp/cramping on lower right quad. Please advise

## 2017-04-11 NOTE — Telephone Encounter (Signed)
Pt informed, Rx sent. 

## 2017-04-12 ENCOUNTER — Ambulatory Visit (INDEPENDENT_AMBULATORY_CARE_PROVIDER_SITE_OTHER): Payer: BLUE CROSS/BLUE SHIELD | Admitting: Obstetrics & Gynecology

## 2017-04-12 ENCOUNTER — Telehealth: Payer: Self-pay | Admitting: *Deleted

## 2017-04-12 ENCOUNTER — Encounter: Payer: Self-pay | Admitting: Obstetrics & Gynecology

## 2017-04-12 VITALS — BP 136/80

## 2017-04-12 DIAGNOSIS — N920 Excessive and frequent menstruation with regular cycle: Secondary | ICD-10-CM | POA: Diagnosis not present

## 2017-04-12 DIAGNOSIS — N946 Dysmenorrhea, unspecified: Secondary | ICD-10-CM | POA: Diagnosis not present

## 2017-04-12 DIAGNOSIS — D251 Intramural leiomyoma of uterus: Secondary | ICD-10-CM | POA: Diagnosis not present

## 2017-04-12 DIAGNOSIS — D252 Subserosal leiomyoma of uterus: Secondary | ICD-10-CM

## 2017-04-12 DIAGNOSIS — D649 Anemia, unspecified: Secondary | ICD-10-CM

## 2017-04-12 NOTE — Progress Notes (Signed)
Barbara Rivera 05-03-1971 [id number]        46 y.o.  G3P3  Accompanied by husband  RP:  F/U from [facility] for worsening abdo-pelvic pain during period, associated with large Myomatous Uterus  HPI:  Patient is scheduled for a Supracervical Abdominal Hysterectomy 05/22/2017 for symptomatic large Uterine Fibroids.  Heavy menses with secondary anemia and severe, worsening Dysmenorrhea.  Symptoms not improved on Junel 1/20.  Presented at [facility] last night in severe abdo-pelvic pain with cramps during her period.  The bleeding was wnl, but the cramps were unbearable.  No chills, no fever.  No UTI Sx, BMs wnl.  Takes FeSO4 supplement on-off.    Past medical history,surgical history, problem list, medications, allergies, family history and social history were all reviewed and documented in the EPIC chart.  Directed ROS with pertinent positives and negatives documented in the history of present illness/assessment and plan.  Exam:  Vitals:   04/12/17 1026  BP: 136/80   General appearance:  Normal  Abdomen:  Soft, NT.  Large Myomatous uterus up to just below the umbilicus.  Mildly tender over enlarged uterus.  Gyn exam:  Vulva normal                     Speculum:  Mild dark menstrual flow.  No abnormal d/c.  Cervix/Vagina normal.    Visit to [facility] 04/11/2017 evening for severe abdo-pelvic pain/cramping with menses.  VSS but BP increased with pain.  CBC as below.  Hb stable but low at 8.5 (8.7 03/11/2017).  Pain controled with Dilaudid and Toradol.                    Results for Barbara Rivera (MRN [id number])   Ref. Range 04/11/2017 19:54  WBC Latest Ref Range: 4.0 - 10.5 K/uL 13.2 (H)  RBC Latest Ref Range: 3.87 - 5.11 MIL/uL 3.87  Hemoglobin Latest Ref Range: 12.0 - 15.0 g/dL 8.5 (L)  HCT Latest Ref Range: 36.0 - 46.0 % 28.4 (L)  MCV Latest Ref Range: 78.0 - 100.0 fL 73.4 (L)  MCH Latest Ref Range: 26.0 - 34.0 pg 22.0 (L)  MCHC Latest Ref Range: 30.0 - 36.0 g/dL 29.9 (L)  RDW Latest Ref Range: 11.5  - 15.5 % 19.5 (H)  Platelets Latest Ref Range: 150 - 400 K/uL 308   UPT neg 04/11/2017   Assessment/Plan:  46 y.o. G3P3   1. Intramural and subserous leiomyoma of uterus Will advance surgery from 9/5th to week of 8/6th.  Recommend TAH/Bilateral Salpingectomy, rather than Supracervical which patient was requesting, given the severity of her symptoms.  Information given on risks of Fibroids growing on Cervix, Vaginal bleeding and recurrence of pain when Cervix is left in place.  Patient will think about it and make a final decision.  Surgery/Risks/Benefits, preop, op and post op thoroughly reviewed.  2. Severe dysmenorrhea Probably necrotizing large Uterine Fibroids.  Will continue on Dilaudid PO until surgery.  OOW until surgery and then PO x 6 wks.  3. Menorrhagia with regular cycle Associated with large myomatous uterus and menorrhagia.  Will start Junel 1.5/30 2 tab x 3 days, then 1 tab qd until the surgery.  4. Secondary anemia Decision to proceed with Feraheme transfusion to improve Hb preop.  Will schedule for Monday at [facility].                        Patient was counseled as to the  risk of surgery to include the following:  1. Infection (prohylactic antibiotics will be administered)  2. DVT/Pulmonary Embolism (prophylactic pneumo compression stockings will be used)  3.Trauma to internal organs requiring additional surgical procedure to repair any injury to     Internal organs requiring perhaps additional hospitalization days.  4.Hemmorhage requiring transfusion and blood products which carry risks such as anaphylactic reaction, hepatitis and AIDS  Patient had received literature information on the procedure scheduled and all her questions were answered and fully accepts all risk.  Counseling on above issues >50% x 25 minutes  Marie-Lyne LavoieMD2:07 PMTD     Princess Bruins MD, 10:37 AM 04/12/2017

## 2017-04-12 NOTE — Telephone Encounter (Signed)
Per Dr Dellis Filbert - Feraheme infusion on Mon 04/15/17. I called [facility] and spoke to [facility] - the pt is down and advised to go to MAU the earlier in the better in the day no guarantee may not have to wait.  Order to be placed by Dr Dellis Filbert.  I called pt to let her know. Left a detailed message but advised to call back to confirm. I also advised we are still waiting for confirmation to change surgery date to the week of Aug 6th Hoping for 8th.  I emailed the patient a letter to be taken out of work starting today until after surgery. KW CMA

## 2017-04-12 NOTE — Patient Instructions (Signed)
1. Intramural and subserous leiomyoma of uterus Will advance surgery from 9/5th to week of 8/6th.  Recommend TAH/Bilateral Salpingectomy, rather than Supracervical which patient was requesting, given the severity of her symptoms.  Information given on risks of Fibroids growing on Cervix, Vaginal bleeding and recurrence of pain when Cervix is left in place.  Patient will think about it and make a final decision.  Surgery/Risks/Benefits, preop, op and post op thoroughly reviewed.  2. Severe dysmenorrhea Probably necrotizing large Uterine Fibroids.  Will continue on Dilaudid PO until surgery.  OOW until surgery and then PO x 6 wks.  3. Menorrhagia with regular cycle Associated with large myomatous uterus and menorrhagia.  Will start Junel 1.5/30 2 tab x 3 days, then 1 tab qd until the surgery.  4. Secondary anemia Decision to proceed with Feraheme transfusion to improve Hb preop.  Will schedule for Monday at [facility].                        Patient was counseled as to the risk of surgery to include the following:  1. Infection (prohylactic antibiotics will be administered)  2. DVT/Pulmonary Embolism (prophylactic pneumo compression stockings will be used)  3.Trauma to internal organs requiring additional surgical procedure to repair any injury to     Internal organs requiring perhaps additional hospitalization days.  4.Hemmorhage requiring transfusion and blood products which carry risks such as anaphylactic reaction, hepatitis and AIDS  Patient had received literature information on the procedure scheduled and all her questions were answered and fully accepts all risk.  Jaeley, please call me as needed before surgery.

## 2017-04-12 NOTE — Telephone Encounter (Signed)
Patient called back with confirmation of my email and message. KW CMA

## 2017-04-15 ENCOUNTER — Telehealth: Payer: Self-pay

## 2017-04-15 ENCOUNTER — Inpatient Hospital Stay (HOSPITAL_COMMUNITY)
Admission: AD | Admit: 2017-04-15 | Discharge: 2017-04-15 | Disposition: A | Discharge status: Home / Self Care | Payer: BLUE CROSS/BLUE SHIELD | Source: Ambulatory Visit | Attending: Gynecology | Admitting: Gynecology

## 2017-04-15 ENCOUNTER — Encounter (HOSPITAL_COMMUNITY): Payer: Self-pay

## 2017-04-15 ENCOUNTER — Other Ambulatory Visit: Payer: Self-pay | Admitting: Obstetrics & Gynecology

## 2017-04-15 DIAGNOSIS — D500 Iron deficiency anemia secondary to blood loss (chronic): Secondary | ICD-10-CM

## 2017-04-15 DIAGNOSIS — D649 Anemia, unspecified: Secondary | ICD-10-CM | POA: Diagnosis not present

## 2017-04-15 DIAGNOSIS — N920 Excessive and frequent menstruation with regular cycle: Secondary | ICD-10-CM | POA: Diagnosis not present

## 2017-04-15 MED ORDER — SODIUM CHLORIDE 0.9 % IV SOLN
INTRAVENOUS | Status: DC
  Administered 2017-04-15: 12:00:00 via INTRAVENOUS

## 2017-04-15 MED ORDER — SODIUM CHLORIDE 0.9 % IV SOLN
510.0000 mg | Freq: Once | INTRAVENOUS | Status: AC
  Administered 2017-04-15: 510 mg via INTRAVENOUS
  Filled 2017-04-15: qty 17

## 2017-04-15 MED ORDER — FERUMOXYTOL INJECTION 510 MG/17 ML: 510.0000 mg | Freq: Once | INTRAVENOUS | 0 refills | Status: DC

## 2017-04-15 NOTE — Progress Notes (Addendum)
G3P3 sent from office for iron infusion.   1121: Provider notified. Left message for orders on patient  1139: called the OB office to get in touch with provider oncall.   1207: IV started with NS infusing  Provider notified for discharge orders.   1245: Discharge instructions given with pt understanding. Pt lef unit via ambulatory

## 2017-04-15 NOTE — Telephone Encounter (Signed)
I called patient to let her know surgery was rescheduled to Aug 8 at 8:30 am at [facility].  Reminded her surgery prepymt due.

## 2017-04-15 NOTE — Discharge Instructions (Signed)
Anemia, Nonspecific Anemia is a condition in which the concentration of red blood cells or hemoglobin in the blood is below normal. Hemoglobin is a substance in red blood cells that carries oxygen to the tissues of the body. Anemia results in not enough oxygen reaching these tissues. What are the causes? Common causes of anemia include:  Excessive bleeding. Bleeding may be internal or external. This includes excessive bleeding from periods (in women) or from the intestine.  Poor nutrition.  Chronic kidney, thyroid, and liver disease.  Bone marrow disorders that decrease red blood cell production.  Cancer and treatments for cancer.  HIV, AIDS, and their treatments.  Spleen problems that increase red blood cell destruction.  Blood disorders.  Excess destruction of red blood cells due to infection, medicines, and autoimmune disorders. What are the signs or symptoms?  Minor weakness.  Dizziness.  Headache.  Palpitations.  Shortness of breath, especially with exercise.  Paleness.  Cold sensitivity.  Indigestion.  Nausea.  Difficulty sleeping.  Difficulty concentrating. Symptoms may occur suddenly or they may develop slowly. How is this diagnosed? Additional blood tests are often needed. These help your health care provider determine the best treatment. Your health care provider will check your stool for blood and look for other causes of blood loss. How is this treated? Treatment varies depending on the cause of the anemia. Treatment can include:  Supplements of iron, vitamin B12, or folic acid.  Hormone medicines.  A blood transfusion. This may be needed if blood loss is severe.  Hospitalization. This may be needed if there is significant continual blood loss.  Dietary changes.  Spleen removal. Follow these instructions at home: Keep all follow-up appointments. It often takes many weeks to correct anemia, and having your health care provider check on your  condition and your response to treatment is very important. Get help right away if:  You develop extreme weakness, shortness of breath, or chest pain.  You become dizzy or have trouble concentrating.  You develop heavy vaginal bleeding.  You develop a rash.  You have bloody or black, tarry stools.  You faint.  You vomit up blood.  You vomit repeatedly.  You have abdominal pain.  You have a fever or persistent symptoms for more than 2-3 days.  You have a fever and your symptoms suddenly get worse.  You are dehydrated. This information is not intended to replace advice given to you by your health care provider. Make sure you discuss any questions you have with your health care provider. Document Released: 10/11/2004 Document Revised: 02/15/2016 Document Reviewed: 02/27/2013 Elsevier Interactive Patient Education  2017 Elsevier Inc.  

## 2017-04-16 ENCOUNTER — Telehealth: Payer: Self-pay

## 2017-04-16 NOTE — Telephone Encounter (Signed)
Patient called in voice mail stating she had question about her upcoming surgery and she wanted Dr. Dellis Filbert to call her directly she did not want to leave a message.  I called her back and got her voice mail and just let her know that Dr. Lenn Cal schedule very busy and not a lot of time for telephone calls but I would forward it and it might be end of day or even tomorrow.  I asked her to call me back and let me know if of high priority or even call back and let me help her if it cannot wait.

## 2017-04-18 NOTE — Patient Instructions (Addendum)
Your procedure is scheduled on: Wednesday April 24, 2017 at 8:30 am  Enter through the [employer] [facility] at: 7:00 am  Pick up the phone at the desk and dial [phone].  Call this number if you have problems the morning of surgery: [phone].  Remember: Do NOT eat food or drink any liquids after: Midnight on Tuesday August 7th Do NOT drink clear liquids after: Take these medicines the morning of surgery with a SIP OF WATER: NONE  Stop multivitamin today  Do NOT wear jewelry (body piercing), metal hair clips/bobby pins, make-up, or nail polish. Do NOT wear lotions, powders, or perfumes.  You may wear deoderant. Do NOT shave for 48 hours prior to surgery. Do NOT bring valuables to the hospital. Contacts, dentures, or bridgework may not be worn into surgery. Leave suitcase in car.  After surgery it may be brought to your room.  For patients admitted to the hospital, checkout time is 11:00 AM the day of discharge.

## 2017-04-19 ENCOUNTER — Encounter (HOSPITAL_COMMUNITY)
Admission: RE | Admit: 2017-04-19 | Discharge: 2017-04-19 | Disposition: A | Discharge status: Home / Self Care | Payer: BLUE CROSS/BLUE SHIELD | Source: Ambulatory Visit | Attending: Obstetrics & Gynecology | Admitting: Obstetrics & Gynecology

## 2017-04-19 ENCOUNTER — Encounter (HOSPITAL_COMMUNITY): Payer: Self-pay

## 2017-04-19 DIAGNOSIS — Z01812 Encounter for preprocedural laboratory examination: Secondary | ICD-10-CM | POA: Diagnosis present

## 2017-04-19 HISTORY — DX: Bronchitis, not specified as acute or chronic: J40

## 2017-04-19 HISTORY — DX: Headache, unspecified: R51.9

## 2017-04-19 HISTORY — DX: Headache: R51

## 2017-04-19 LAB — TYPE AND SCREEN
ABO/RH(D): B POS
Antibody Screen: NEGATIVE

## 2017-04-19 LAB — CBC
HCT: 31.7 % — ABNORMAL LOW (ref 36.0–46.0)
Hemoglobin: 9.4 g/dL — ABNORMAL LOW (ref 12.0–15.0)
MCH: 22.3 pg — AB (ref 26.0–34.0)
MCHC: 29.7 g/dL — ABNORMAL LOW (ref 30.0–36.0)
MCV: 75.3 fL — ABNORMAL LOW (ref 78.0–100.0)
PLATELETS: 344 10*3/uL (ref 150–400)
RBC: 4.21 MIL/uL (ref 3.87–5.11)
RDW: 20.7 % — ABNORMAL HIGH (ref 11.5–15.5)
WBC: 9.5 10*3/uL (ref 4.0–10.5)

## 2017-04-19 LAB — ABO/RH: ABO/RH(D): B POS

## 2017-04-19 NOTE — Telephone Encounter (Signed)
Patient called back and said you can reach her at [phone] on monday

## 2017-04-19 NOTE — Telephone Encounter (Signed)
Called patient back, left a message on answering machine.

## 2017-04-22 ENCOUNTER — Encounter: Payer: Self-pay | Admitting: Anesthesiology

## 2017-04-24 ENCOUNTER — Encounter (HOSPITAL_COMMUNITY): Payer: Self-pay

## 2017-04-24 ENCOUNTER — Encounter (HOSPITAL_COMMUNITY)
Admission: RE | Disposition: A | Discharge status: Home / Self Care | Payer: Self-pay | Source: Ambulatory Visit | Attending: Obstetrics & Gynecology

## 2017-04-24 ENCOUNTER — Inpatient Hospital Stay (HOSPITAL_COMMUNITY): Payer: BLUE CROSS/BLUE SHIELD | Admitting: Anesthesiology

## 2017-04-24 ENCOUNTER — Inpatient Hospital Stay (HOSPITAL_COMMUNITY)
Admission: RE | Admit: 2017-04-24 | Discharge: 2017-04-25 | DRG: 743 | Disposition: A | Discharge status: Home / Self Care | Payer: BLUE CROSS/BLUE SHIELD | Source: Ambulatory Visit | Attending: Obstetrics & Gynecology | Admitting: Obstetrics & Gynecology

## 2017-04-24 DIAGNOSIS — N946 Dysmenorrhea, unspecified: Secondary | ICD-10-CM | POA: Diagnosis present

## 2017-04-24 DIAGNOSIS — D649 Anemia, unspecified: Secondary | ICD-10-CM | POA: Diagnosis present

## 2017-04-24 DIAGNOSIS — R102 Pelvic and perineal pain: Secondary | ICD-10-CM | POA: Diagnosis present

## 2017-04-24 DIAGNOSIS — D259 Leiomyoma of uterus, unspecified: Principal | ICD-10-CM | POA: Diagnosis present

## 2017-04-24 DIAGNOSIS — Z9889 Other specified postprocedural states: Secondary | ICD-10-CM

## 2017-04-24 DIAGNOSIS — D251 Intramural leiomyoma of uterus: Secondary | ICD-10-CM | POA: Diagnosis not present

## 2017-04-24 DIAGNOSIS — N920 Excessive and frequent menstruation with regular cycle: Secondary | ICD-10-CM | POA: Diagnosis present

## 2017-04-24 DIAGNOSIS — D252 Subserosal leiomyoma of uterus: Secondary | ICD-10-CM

## 2017-04-24 DIAGNOSIS — Z9851 Tubal ligation status: Secondary | ICD-10-CM

## 2017-04-24 HISTORY — PX: HYSTERECTOMY ABDOMINAL WITH SALPINGECTOMY: SHX6725

## 2017-04-24 LAB — PREGNANCY, URINE: Preg Test, Ur: NEGATIVE

## 2017-04-24 LAB — TYPE AND SCREEN
ABO/RH(D): B POS
ANTIBODY SCREEN: NEGATIVE

## 2017-04-24 SURGERY — HYSTERECTOMY, TOTAL, ABDOMINAL, WITH SALPINGECTOMY
Anesthesia: General | Laterality: Bilateral

## 2017-04-24 MED ORDER — FENTANYL CITRATE (PF) 100 MCG/2ML IJ SOLN
INTRAMUSCULAR | Status: DC | PRN
  Administered 2017-04-24: 50 ug via INTRAVENOUS
  Administered 2017-04-24: 100 ug via INTRAVENOUS
  Administered 2017-04-24: 50 ug via INTRAVENOUS
  Administered 2017-04-24: 100 ug via INTRAVENOUS
  Administered 2017-04-24 (×3): 50 ug via INTRAVENOUS

## 2017-04-24 MED ORDER — PROPOFOL 10 MG/ML IV BOLUS
INTRAVENOUS | Status: AC
  Filled 2017-04-24: qty 20

## 2017-04-24 MED ORDER — HYDROMORPHONE HCL 1 MG/ML IJ SOLN
0.5000 mg | INTRAMUSCULAR | Status: AC
  Administered 2017-04-24: 0.5 mg via INTRAVENOUS

## 2017-04-24 MED ORDER — HYDROMORPHONE HCL 1 MG/ML IJ SOLN
INTRAMUSCULAR | Status: AC
  Administered 2017-04-24: 0.5 mg via INTRAVENOUS
  Filled 2017-04-24: qty 1

## 2017-04-24 MED ORDER — SUGAMMADEX SODIUM 200 MG/2ML IV SOLN
INTRAVENOUS | Status: DC | PRN
  Administered 2017-04-24: 200 mg via INTRAVENOUS

## 2017-04-24 MED ORDER — HYDROMORPHONE HCL 1 MG/ML IJ SOLN
0.5000 mg | INTRAMUSCULAR | Status: DC | PRN
  Administered 2017-04-24: 0.5 mg via INTRAVENOUS
  Filled 2017-04-24 (×2): qty 0.5

## 2017-04-24 MED ORDER — LIDOCAINE HCL (CARDIAC) 20 MG/ML IV SOLN
INTRAVENOUS | Status: AC
  Filled 2017-04-24: qty 5

## 2017-04-24 MED ORDER — ACETAMINOPHEN 160 MG/5ML PO SOLN
ORAL | Status: AC
  Administered 2017-04-24: 975 mg via ORAL
  Filled 2017-04-24: qty 40.6

## 2017-04-24 MED ORDER — LIDOCAINE HCL (CARDIAC) 20 MG/ML IV SOLN
INTRAVENOUS | Status: DC | PRN
  Administered 2017-04-24: 100 mg via INTRAVENOUS

## 2017-04-24 MED ORDER — ROCURONIUM BROMIDE 100 MG/10ML IV SOLN
INTRAVENOUS | Status: DC | PRN
  Administered 2017-04-24: 20 mg via INTRAVENOUS
  Administered 2017-04-24: 50 mg via INTRAVENOUS

## 2017-04-24 MED ORDER — SCOPOLAMINE 1 MG/3DAYS TD PT72
MEDICATED_PATCH | TRANSDERMAL | Status: AC
  Administered 2017-04-24: 1.5 mg via TRANSDERMAL
  Filled 2017-04-24: qty 1

## 2017-04-24 MED ORDER — ONDANSETRON HCL 4 MG/2ML IJ SOLN
INTRAMUSCULAR | Status: AC
  Filled 2017-04-24: qty 2

## 2017-04-24 MED ORDER — IBUPROFEN 600 MG PO TABS
600.0000 mg | ORAL_TABLET | Freq: Four times a day (QID) | ORAL | Status: DC | PRN
  Administered 2017-04-24 – 2017-04-25 (×2): 600 mg via ORAL
  Filled 2017-04-24 (×2): qty 1

## 2017-04-24 MED ORDER — FENTANYL CITRATE (PF) 100 MCG/2ML IJ SOLN
INTRAMUSCULAR | Status: AC
  Filled 2017-04-24: qty 2

## 2017-04-24 MED ORDER — BUPIVACAINE HCL (PF) 0.25 % IJ SOLN
INTRAMUSCULAR | Status: DC | PRN
  Administered 2017-04-24: 10 mL

## 2017-04-24 MED ORDER — MIDAZOLAM HCL 2 MG/2ML IJ SOLN
INTRAMUSCULAR | Status: AC
  Filled 2017-04-24: qty 2

## 2017-04-24 MED ORDER — HYDROMORPHONE HCL 1 MG/ML IJ SOLN
0.2500 mg | INTRAMUSCULAR | Status: DC | PRN
  Administered 2017-04-24 (×4): 0.5 mg via INTRAVENOUS

## 2017-04-24 MED ORDER — OXYCODONE-ACETAMINOPHEN 5-325 MG PO TABS: 1.0000 | ORAL_TABLET | ORAL | Status: DC | PRN

## 2017-04-24 MED ORDER — SODIUM CHLORIDE 0.9 % IJ SOLN
INTRAMUSCULAR | Status: AC
  Filled 2017-04-24: qty 20

## 2017-04-24 MED ORDER — CEFAZOLIN SODIUM-DEXTROSE 2-4 GM/100ML-% IV SOLN
2.0000 g | INTRAVENOUS | Status: AC
  Administered 2017-04-24: 2 g via INTRAVENOUS

## 2017-04-24 MED ORDER — BUPIVACAINE HCL (PF) 0.25 % IJ SOLN
INTRAMUSCULAR | Status: AC
  Filled 2017-04-24: qty 30

## 2017-04-24 MED ORDER — SUGAMMADEX SODIUM 200 MG/2ML IV SOLN
INTRAVENOUS | Status: AC
  Filled 2017-04-24: qty 2

## 2017-04-24 MED ORDER — PROPOFOL 10 MG/ML IV BOLUS
INTRAVENOUS | Status: DC | PRN
  Administered 2017-04-24: 150 mg via INTRAVENOUS
  Administered 2017-04-24: 50 mg via INTRAVENOUS

## 2017-04-24 MED ORDER — LACTATED RINGERS IV SOLN
INTRAVENOUS | Status: DC
  Administered 2017-04-24: 13:00:00 via INTRAVENOUS

## 2017-04-24 MED ORDER — KETOROLAC TROMETHAMINE 30 MG/ML IJ SOLN
INTRAMUSCULAR | Status: DC | PRN
  Administered 2017-04-24: 30 mg via INTRAVENOUS

## 2017-04-24 MED ORDER — HYDROMORPHONE HCL 1 MG/ML IJ SOLN
INTRAMUSCULAR | Status: DC | PRN
  Administered 2017-04-24: 1 mg via INTRAVENOUS

## 2017-04-24 MED ORDER — DEXAMETHASONE SODIUM PHOSPHATE 4 MG/ML IJ SOLN
INTRAMUSCULAR | Status: AC
  Filled 2017-04-24: qty 1

## 2017-04-24 MED ORDER — LACTATED RINGERS IV SOLN
INTRAVENOUS | Status: DC
  Administered 2017-04-24: 09:00:00 via INTRAVENOUS

## 2017-04-24 MED ORDER — CEFAZOLIN SODIUM-DEXTROSE 2-4 GM/100ML-% IV SOLN
INTRAVENOUS | Status: AC
  Filled 2017-04-24: qty 100

## 2017-04-24 MED ORDER — ROCURONIUM BROMIDE 100 MG/10ML IV SOLN
INTRAVENOUS | Status: AC
  Filled 2017-04-24: qty 1

## 2017-04-24 MED ORDER — BUPIVACAINE LIPOSOME 1.3 % IJ SUSP
20.0000 mL | Freq: Once | INTRAMUSCULAR | Status: AC
  Administered 2017-04-24: 20 mL
  Filled 2017-04-24: qty 20

## 2017-04-24 MED ORDER — LACTATED RINGERS IV SOLN
INTRAVENOUS | Status: DC
  Administered 2017-04-24: 12:00:00 via INTRAVENOUS
  Administered 2017-04-24: 125 mL/h via INTRAVENOUS

## 2017-04-24 MED ORDER — HYDROMORPHONE HCL 1 MG/ML IJ SOLN
INTRAMUSCULAR | Status: AC
  Filled 2017-04-24: qty 1

## 2017-04-24 MED ORDER — PHENYLEPHRINE HCL 10 MG/ML IJ SOLN
INTRAMUSCULAR | Status: DC | PRN
  Administered 2017-04-24: 40 ug via INTRAVENOUS
  Administered 2017-04-24: 80 ug via INTRAVENOUS

## 2017-04-24 MED ORDER — SCOPOLAMINE 1 MG/3DAYS TD PT72
1.0000 | MEDICATED_PATCH | Freq: Once | TRANSDERMAL | Status: DC
  Administered 2017-04-24: 1.5 mg via TRANSDERMAL

## 2017-04-24 MED ORDER — HYDROMORPHONE HCL 1 MG/ML IJ SOLN
1.0000 mg | INTRAMUSCULAR | Status: DC | PRN
  Administered 2017-04-24 – 2017-04-25 (×2): 1 mg via INTRAVENOUS
  Filled 2017-04-24 (×2): qty 1

## 2017-04-24 MED ORDER — MIDAZOLAM HCL 2 MG/2ML IJ SOLN
INTRAMUSCULAR | Status: DC | PRN
  Administered 2017-04-24: 2 mg via INTRAVENOUS

## 2017-04-24 MED ORDER — ONDANSETRON HCL 4 MG/2ML IJ SOLN
INTRAMUSCULAR | Status: DC | PRN
  Administered 2017-04-24: 4 mg via INTRAVENOUS

## 2017-04-24 MED ORDER — ACETAMINOPHEN 160 MG/5ML PO SOLN
975.0000 mg | Freq: Once | ORAL | Status: AC
  Administered 2017-04-24: 975 mg via ORAL

## 2017-04-24 MED ORDER — SODIUM CHLORIDE 0.9 % IJ SOLN
INTRAMUSCULAR | Status: AC
  Filled 2017-04-24: qty 10

## 2017-04-24 MED ORDER — FENTANYL CITRATE (PF) 250 MCG/5ML IJ SOLN
INTRAMUSCULAR | Status: AC
  Filled 2017-04-24: qty 5

## 2017-04-24 MED ORDER — DEXAMETHASONE SODIUM PHOSPHATE 10 MG/ML IJ SOLN
INTRAMUSCULAR | Status: DC | PRN
  Administered 2017-04-24: 4 mg via INTRAVENOUS

## 2017-04-24 SURGICAL SUPPLY — 38 items
CANISTER SUCT 3000ML PPV (MISCELLANEOUS) ×3 IMPLANT
CHLORAPREP W/TINT 26ML (MISCELLANEOUS) ×2 IMPLANT
CLOTH BEACON ORANGE TIMEOUT ST (SAFETY) ×3 IMPLANT
DECANTER SPIKE VIAL GLASS SM (MISCELLANEOUS) IMPLANT
DRAPE CESAREAN BIRTH W POUCH (DRAPES) ×3 IMPLANT
DRAPE WARM FLUID 44X44 (DRAPE) ×2 IMPLANT
DRSG OPSITE POSTOP 4X10 (GAUZE/BANDAGES/DRESSINGS) ×3 IMPLANT
GAUZE SPONGE 4X4 16PLY XRAY LF (GAUZE/BANDAGES/DRESSINGS) ×2 IMPLANT
GLOVE BIO SURGEON STRL SZ 6.5 (GLOVE) ×2 IMPLANT
GLOVE BIO SURGEONS STRL SZ 6.5 (GLOVE) ×1
GLOVE BIOGEL PI IND STRL 7.0 (GLOVE) ×4 IMPLANT
GLOVE BIOGEL PI INDICATOR 7.0 (GLOVE) ×8
GOWN STRL REUS W/TWL LRG LVL3 (GOWN DISPOSABLE) ×9 IMPLANT
NEEDLE HYPO 22GX1.5 SAFETY (NEEDLE) ×6 IMPLANT
PACK ABDOMINAL GYN (CUSTOM PROCEDURE TRAY) ×3 IMPLANT
PAD OB MATERNITY 4.3X12.25 (PERSONAL CARE ITEMS) ×3 IMPLANT
PENCIL SMOKE EVAC W/HOLSTER (ELECTROSURGICAL) ×3 IMPLANT
PROTECTOR NERVE ULNAR (MISCELLANEOUS) ×3 IMPLANT
SPONGE LAP 18X18 X RAY DECT (DISPOSABLE) ×6 IMPLANT
STAPLER VISISTAT 35W (STAPLE) ×3 IMPLANT
SUT PLAIN 3 0 CT 1 27 (SUTURE) IMPLANT
SUT PROLENE 3 0 SH DA (SUTURE) IMPLANT
SUT VIC AB 0 CT1 18XCR BRD8 (SUTURE) ×2 IMPLANT
SUT VIC AB 0 CT1 27 (SUTURE) ×12
SUT VIC AB 0 CT1 27XBRD ANBCTR (SUTURE) ×4 IMPLANT
SUT VIC AB 0 CT1 36 (SUTURE) ×6 IMPLANT
SUT VIC AB 0 CT1 8-18 (SUTURE) ×6
SUT VIC AB 2-0 SH 27 (SUTURE)
SUT VIC AB 2-0 SH 27XBRD (SUTURE) IMPLANT
SUT VIC AB 3-0 SH 27 (SUTURE)
SUT VIC AB 3-0 SH 27X BRD (SUTURE) IMPLANT
SUT VICRYL 0 TIES 12 18 (SUTURE) ×3 IMPLANT
SUT VICRYL 1 TIES 12X18 (SUTURE) IMPLANT
SUT VICRYL 3 0 BR 18  UND (SUTURE)
SUT VICRYL 3 0 BR 18 UND (SUTURE) IMPLANT
SYR CONTROL 10ML LL (SYRINGE) ×6 IMPLANT
TOWEL OR 17X24 6PK STRL BLUE (TOWEL DISPOSABLE) ×6 IMPLANT
TRAY FOLEY CATH SILVER 14FR (SET/KITS/TRAYS/PACK) ×3 IMPLANT

## 2017-04-24 NOTE — H&P (Signed)
Barbara Rivera is an 46 y.o. female. G3P3    RP:  Severe abdo-pelvic pain during period with heavy bleeding and anemia associated with large Myomatous Uterus  HPI:  Heavy menses with secondary anemia and severe, worsening Dysmenorrhea.  Symptoms not improved on Junel 1/20.  Presented at [facility] with severe abdo-pelvic pain with cramps during her period 2 weeks ago.  The bleeding was wnl, but the cramps were unbearable so the surgery was advanced to today.  She received Feraheme IV last week for a Hb at 8+ and has controled the pain on Dilaudid PO.  No chills, no fever.  No UTI Sx, BMs wnl.  Takes FeSO4 supplement on-off.    Pertinent Gynecological History: Menses: Menorrhagia Contraception: OCP (estrogen/progesterone) Blood transfusions: none Sexually transmitted diseases: no past history Last mammo:  Never. Last pap: normal  OB History: G3, P3   Menstrual History:  Patient's last menstrual period was 04/02/2017.    Past Medical History:  Diagnosis Date  . Anemia   . Bronchitis 2008  . Dysmenorrhea   . Fibroid tumor   . Headache    after stopped pain medicine    Past Surgical History:  Procedure Laterality Date  . CERVICAL CERCLAGE    . CESAREAN SECTION      Family History  Problem Relation Age of Onset  . Hypertension Mother   . Diabetes Maternal Grandfather   . Hypertension Maternal Grandfather   . Heart attack Paternal Grandmother   . Stroke Paternal Grandmother   . Heart attack Paternal Grandfather     Social History:  reports that she has never smoked. She has never used smokeless tobacco. She reports that she drinks about 0.6 oz of alcohol per week . She reports that she does not use drugs.  Allergies:  Allergies  Allergen Reactions  . Betadine [Povidone Iodine] Other (See Comments)    "eats off a layer of skin"  . Sulfa Antibiotics Rash    Prescriptions Prior to Admission  Medication Sig Dispense Refill Last Dose  . acetaminophen (TYLENOL) 500 MG tablet  Take 1,000 mg by mouth every 6 (six) hours as needed (for pain.).   04/23/2017 at Unknown time  . ferrous sulfate 325 (65 FE) MG tablet Take 1 tablet (325 mg total) by mouth 3 (three) times daily with meals. 90 tablet 0 Past Week at Unknown time  . HYDROmorphone (DILAUDID) 2 MG tablet Take 1 tablet (2 mg total) by mouth every 4 (four) hours as needed for severe pain. 30 tablet 0 04/23/2017 at Unknown time  . ketorolac (TORADOL) 10 MG tablet Take 1 tablet (10 mg total) by mouth every 6 (six) hours as needed. (Patient taking differently: Take 10 mg by mouth every 6 (six) hours as needed (for pain.). ) 20 tablet 0 Past Week at Unknown time  . Multiple Vitamin (MULTIVITAMIN WITH MINERALS) TABS tablet Take 1 tablet by mouth daily.   Past Month at Unknown time  . Norethindrone Acetate-Ethinyl Estradiol (JUNEL,LOESTRIN,MICROGESTIN) 1.5-30 MG-MCG tablet Take two pills per day for 3 days, then one pill per day. (Patient taking differently: Take 1 tablet by mouth daily. =) 3 Package 1 04/23/2017 at Unknown time    ROS Neg  Results for Barbara Rivera, Barbara Rivera (MRN [id number]) as of 04/24/2017 07:52  Ref. Range 04/19/2017 12:50  WBC Latest Ref Range: 4.0 - 10.5 K/uL 9.5  RBC Latest Ref Range: 3.87 - 5.11 MIL/uL 4.21  Hemoglobin Latest Ref Range: 12.0 - 15.0 g/dL 9.4 (L)  HCT Latest  Ref Range: 36.0 - 46.0 % 31.7 (L)  MCV Latest Ref Range: 78.0 - 100.0 fL 75.3 (L)  MCH Latest Ref Range: 26.0 - 34.0 pg 22.3 (L)  MCHC Latest Ref Range: 30.0 - 36.0 g/dL 29.7 (L)  RDW Latest Ref Range: 11.5 - 15.5 % 20.7 (H)  Platelets Latest Ref Range: 150 - 400 K/uL 344    Last menstrual period 04/02/2017. Physical Exam VSS  See Office notes  Assessment/Plan:  1. Intramural and subserous leiomyoma of uterus Recommended TAH/Bilateral Salpingectomy, rather than Supracervical which patient was requesting, given the severity of her symptoms.  Information given on risks of Fibroids growing on Cervix, Vaginal bleeding and recurrence of pain  when Cervix is left in place.  Patient agrees with TAH/Bilateral Salpingectomy.  Surgery/Risks/Benefits, preop, op and post op thoroughly reviewed.  Informed consent signed.  2. Severe dysmenorrhea Probably necrotizing large Uterine Fibroids.  Continued on Dilaudid PO until surgery.    3. Menorrhagia with regular cycle Associated with large myomatous uterus and menorrhagia.  OnJunel 1.5/30 2 tab x 3 days, then 1 tab qd until the surgery.  4. Secondary anemia Feraheme transfusion given x 1, Hb improved to 9+.                          Patient was counseled as to the risk of surgery to include the following:  1. Infection (prohylactic antibiotics will be administered)  2. DVT/Pulmonary Embolism (prophylactic pneumo compression stockings will be used)  3.Trauma to internal organs requiring additional surgical procedure to repair any injury to     Internal organs requiring perhaps additional hospitalization days.  4.Hemmorhage requiring transfusion and blood products which carry risks such as anaphylactic reaction, hepatitis and AIDS  Patient had received literature information on the procedure scheduled and all her questions were answered and fully accepts all risk.   Barbara Rivera 04/24/2017, 7:45 AM

## 2017-04-24 NOTE — OR Nursing (Signed)
Specimen weighed in OR prior to sending to pathology. Specimen weighed 720.8 grams. MD aware.

## 2017-04-24 NOTE — Transfer of Care (Signed)
Immediate Anesthesia Transfer of Care Note  Patient: Barbara Rivera  Procedure(s) Performed: Procedure(s): TOTAL ABDOMINAL HYSTERECTOMY WITH BILATERAL SALPINGECTOMY (Bilateral)  Patient Location: PACU  Anesthesia Type:General  Level of Consciousness: awake, alert  and oriented  Airway & Oxygen Therapy: Patient Spontanous Breathing and Patient connected to nasal cannula oxygen  Post-op Assessment: Report given to RN and Post -op Vital signs reviewed and stable  Post vital signs: Reviewed and stable  Last Vitals:  Vitals:   04/24/17 0745  BP: 126/62  Pulse: 74  Resp: 16  Temp: 36.9 C    Last Pain:  Vitals:   04/24/17 0745  TempSrc: Oral      Patients Stated Pain Goal: 4 (40/37/54 3606)  Complications: No apparent anesthesia complications

## 2017-04-24 NOTE — Plan of Care (Signed)
Problem: Pain Management: Goal: Relief or control of pain will improve Outcome: Progressing Pain scale discussed with patient and patient verbalizes understanding.

## 2017-04-24 NOTE — Op Note (Signed)
Operative Note  04/24/2017  10:55 AM  PATIENT:  Barbara Rivera  46 y.o. female  PRE-OPERATIVE DIAGNOSIS:  Large uterine fibroids, menorrhagia, anemia, pelvic pain  POST-OPERATIVE DIAGNOSIS:  Lrge uterine fibroids, menorrhagia, anemia, pelvic pain  PROCEDURE:  Procedure(s): TOTAL ABDOMINAL HYSTERECTOMY WITH BILATERAL SALPINGECTOMY  SURGEON:  Surgeon(s): Princess Bruins, MD Fontaine, Belinda Block, MD  ANESTHESIA:   general  FINDINGS:  Large uterus with Fibroids  DESCRIPTION OF OPERATION:  Under general anesthesia with endotracheal intubation the patient is in decubitus dorsal position she is prepped with Techni-Care care at the suprapubic, vulvar and vaginal areas.  The Foley is put in place in the bladder.  The abdomen is prepped with ChloraPrep.  The patient is draped as usual.  The subcutaneous tissue was infiltrated with Marcaine one quarter plain 10 cc.  We make a Pfannenstiel incision at the site of the previous one with the scalpel.  The adipose tissue was opened with the electrocautery. The aponeurosis is opened transversely with the electrocautery and Mayo scissors. The recti muscles are separated from the aponeurosis on the midline superiorly and inferiorly. The parietal peritoneum is opened longitudinally with Metzenbaums scissors.  Abdominopelvic inspection reveals no abdominal pathology. The uterus is enlarged with fibroids, both tubes are status post tubal ligation.  Both ovaries are normal in size and appearance.  The Balfour retractor is put in place with the bladder blade. 3 laps are used to retain the bowels and the abdomen.  Kelly clamps are applied at each uterine cornua.  We starts on the left side with cauterization of the left round ligament. The visceral peritoneum is opened anteriorly with the electrocautery and Metzenbaums scissors and the bladder is descended.  We then created window in the broad ligament on the left side and clamp the utero-ovarian ligament and the  proximal aspect of the tube with a curved Masterson clamp.  We section with Mayo scissors.  We suture with a free tie and as stitch of Vicryl 0.  The left mesosalpinx is clamped with a curved Heaney, we section with Mayo scissors and we suture with a free tie.  The left distal tube will be sent to pathology with the rest of the specimen.  We then further descend the bladder anteriorly and skeletonized the left uterine artery using Metzenbaums scissors.  We proceed exactly the same way on the right side.  The bladder is further descended.  We then used a curved Masterson to clamp to the left uterine artery, a straight clamp was on applied on the uterus at that level. We section with Mayo scissors and suture with a Vicryl 0.  We proceed the same way on the right side.  With the scalpel we section at the junction of the cervix and uterus to detach the uterus and obtain a better visualization.  The anterior and posterior aspect of the cervix are clamped with cokers.  We then used straight Masterson's to descend along the cervix.  We clamp with a straight Masterson, we section with the scalpel and suture with a Heaney stitch of Vicryl 0.  The vaginal angles are reached on both sides.  We use curved Heaneys to clamp on both sides.  We section with Mayo scissors and suture with a Heaney stitch of Vicryl 0.  Those stitches are kept on hemostats.  We then complete the opening of the vagina with Jorgenson's scissors.  We grasped the upper vagina with cokers anteriorly and posteriorly.  The cervix the uterus and both tubes  are sent to pathology.  We used off figure-of-eight's to complete the closure of the vagina with Vicryl 0.  We irrigated and suctioned the pelvic cavity.  Hemostasis was completed where necessary with the Bovie.  Hemostasis is adequate at all levels.  The 3 laps are removed from the abdomen.  The Balfour retractor is removed.  Hemostasis was completed on the aponeurosis and recti muscles with the Bovie.  The  aponeurosis is closed into half running suture of Vicryl 0.  Hemostasis was completed on the adipose tissue with the Bovie. The adipose tissue is closed with a running suture of plain 2-0.  More local anesthesia is added at the subcutaneous tissue. The skin is closed with a subcuticular suture of Monocryl 3-0.  Dermabond was added. A honeycomb dressing is applied and a pressure dressing.  The patient is brought to recovery room in good and stable status.     ESTIMATED BLOOD LOSS: 200 cc   Intake/Output Summary (Last 24 hours) at 04/24/17 1055 Last data filed at 04/24/17 1030  Gross per 24 hour  Intake              800 ml  Output              275 ml  Net              525 ml     BLOOD ADMINISTERED:none   LOCAL MEDICATIONS USED:  MARCAINE     SPECIMEN:  Source of Specimen:  Uterus with cervix and bilateral tubes  DISPOSITION OF SPECIMEN:  PATHOLOGY  COUNTS:  YES  PLAN OF CARE: Transfer to PACU  Marie-Lyne LavoieMD10:55 AM

## 2017-04-24 NOTE — Anesthesia Procedure Notes (Signed)
Procedure Name: Intubation Date/Time: 04/24/2017 8:51 AM Performed by: Keelan Tripodi, Sheron Nightingale Pre-anesthesia Checklist: Patient identified, Emergency Drugs available, Suction available, Patient being monitored and Timeout performed Patient Re-evaluated:Patient Re-evaluated prior to induction Oxygen Delivery Method: Circle system utilized Preoxygenation: Pre-oxygenation with 100% oxygen Induction Type: IV induction Ventilation: Two handed mask ventilation required Laryngoscope Size: Mac and 3 Grade View: Grade III Tube type: Oral Tube size: 7.0 mm Number of attempts: 1 Placement Confirmation: ETT inserted through vocal cords under direct vision Secured at: 22 cm Dental Injury: Teeth and Oropharynx as per pre-operative assessment

## 2017-04-24 NOTE — Anesthesia Preprocedure Evaluation (Signed)
Anesthesia Evaluation  Patient identified by MRN, date of birth, ID band Patient awake    Reviewed: Allergy & Precautions, H&P , Patient's Chart, lab work & pertinent test results, reviewed documented beta blocker date and time   Airway Mallampati: II  TM Distance: >3 FB Neck ROM: full    Dental no notable dental hx.    Pulmonary    Pulmonary exam normal breath sounds clear to auscultation       Cardiovascular  Rhythm:regular Rate:Normal     Neuro/Psych    GI/Hepatic   Endo/Other    Renal/GU      Musculoskeletal   Abdominal   Peds  Hematology  (+) anemia ,   Anesthesia Other Findings   Reproductive/Obstetrics                             Anesthesia Physical Anesthesia Plan  ASA: II  Anesthesia Plan: General   Post-op Pain Management:    Induction: Intravenous  PONV Risk Score and Plan:   Airway Management Planned: Oral ETT  Additional Equipment:   Intra-op Plan:   Post-operative Plan: Extubation in OR  Informed Consent: I have reviewed the patients History and Physical, chart, labs and discussed the procedure including the risks, benefits and alternatives for the proposed anesthesia with the patient or authorized representative who has indicated his/her understanding and acceptance.   Dental Advisory Given  Plan Discussed with: CRNA and Surgeon  Anesthesia Plan Comments: (  )        Anesthesia Quick Evaluation  

## 2017-04-25 LAB — CBC
HEMATOCRIT: 31 % — AB (ref 36.0–46.0)
Hemoglobin: 9.4 g/dL — ABNORMAL LOW (ref 12.0–15.0)
MCH: 23.4 pg — AB (ref 26.0–34.0)
MCHC: 30.3 g/dL (ref 30.0–36.0)
MCV: 77.3 fL — AB (ref 78.0–100.0)
Platelets: 343 10*3/uL (ref 150–400)
RBC: 4.01 MIL/uL (ref 3.87–5.11)
RDW: 22.7 % — AB (ref 11.5–15.5)
WBC: 9.4 10*3/uL (ref 4.0–10.5)

## 2017-04-25 MED ORDER — OXYCODONE-ACETAMINOPHEN 7.5-325 MG PO TABS
1.0000 | ORAL_TABLET | ORAL | 0 refills | Status: DC | PRN
Start: 2017-04-25 — End: 2017-05-16

## 2017-04-25 NOTE — Progress Notes (Signed)
Discharge instructions and prescription given, questions answered, pt states understanding. Signs and given copy

## 2017-04-25 NOTE — Discharge Instructions (Signed)
Abdominal Hysterectomy Abdominal hysterectomy is a surgery to remove your womb (uterus). The womb is the part of your body that holds a growing baby. You may need this procedure if:  You have cancer.  You have growths (tumors or fibroids) in your uterus.  You have long-term (chronic) pain.  You are bleeding.  Your womb has slipped down into your vagina.  You have a condition in which the tissue that lines the womb grows outside of its normal place.  You have an infection in your womb.  You have problems with your period.  You may also need other reproductive parts removed. This will depend on why you need to have the surgery. What happens before the procedure? Staying hydrated Follow instructions from your doctor about hydration. This may include:  Up to 2 hours before the procedure - you may continue to drink clear liquids, such as: ? Water. ? Fruit juice. ? Black coffee. ? Plain tea.  Eating and drinking restrictions Follow instructions from your doctor about eating and drinking. These may include:  8 hours before the procedure - stop eating heavy meals or foods, such as: ? Meat. ? Fried foods. ? Fatty foods.  6 hours before the procedure - stop eating light meals or foods, such as: ? Toast. ? Cereal.  6 hours before the procedure - stop drinking: ? Milk ? Drinks that have milk in them.  2 hours before the procedure - stop drinking clear liquids.  Medicines  Ask your doctor about: ? Changing or stopping your normal medicines. This is important if you take diabetes medicines or blood thinners. ? Taking medicines such as aspirin and ibuprofen. These medicines can thin your blood. Do not take these medicines before your procedure if your doctor tells you not to.  You may be given antibiotic medicine. This can help prevent infection.  You may be asked to take medicines that help you poop (laxatives). General instructions  Ask your doctor how your surgical site  will be marked or identified.  You may be asked to shower with a germ-killing soap.  Plan to have someone take you home from the hospital.  Do not use any products that contain nicotine or tobacco, such as cigarettes and e-cigarettes. If you need help quitting, ask your doctor.  You may have an exam or tests done.  You may have a blood or urine sample taken.  You may need to have an enema to clean out your rectum and lower colon.  Talk to your doctor about the changes this procedure may cause. These can be physical or emotional. What happens during the procedure?  To lower your risk of infection: ? Your health care team will wash or clean their hands. ? Your skin will be washed with soap. ? Hair may be removed from the surgical area.  An IV tube will be put into one of your veins.  You will be given one or more of the following: ? A medicine to help you relax (sedative). ? A medicine to make you fall asleep (general anesthetic).  Tight-fitting (compression) stockings will be placed on your legs to help with circulation.  A thin, flexible tube (catheter) will be inserted to help drain your urine.  The doctor will make a cut (incision) through the skin in your lower belly. It may go side-to-side or up-and-down.  The doctor will move the body tissues that cover your womb.  The doctor will remove your womb.  The doctor may take  A thin, flexible tube (catheter) will be inserted to help drain your urine.   The doctor will make a cut (incision) through the skin in your lower belly. It may go side-to-side or up-and-down.   The doctor will move the body tissues that cover your womb.   The doctor will remove your womb.   The doctor may take out any other parts that need to be removed.   The doctor will control the bleeding.   The doctor will close your cut with stitches (sutures), skin glue, or adhesive strips.   A bandage (dressing) will be placed over the cut.  The procedure may vary among doctors and hospitals.  What happens after the procedure?   You will be given pain medicine if you need it.   Your blood pressure, heart rate, breathing rate, and blood oxygen level will be watched until the medicines you were given have worn off.   You will need to stay in the hospital to recover. Ask  your doctor how long you will need to stay in the hospital after your procedure.   You may have a liquid diet at first. You will most likely return to your usual diet the day after surgery.   You will still have the urinary catheter in place. It will likely be removed the day after surgery.   You may have to wear compression stockings. These stockings help to prevent blood clots and reduce swelling in your legs.   You will be encouraged to walk as soon as possible. You will also use a device or do breathing exercises to keep your lungs clear.   You may need to use a sanitary pad for vaginal discharge.  Summary   Abdominal hysterectomy is a surgery to remove your womb (uterus). The womb is the part of your body that holds a growing baby.   Talk to your doctor about the changes this procedure may cause. These can be physical or emotional.   You will be given pain medicine if you need it.   You will need to stay in the hospital to recover for one to two days. Ask your doctor how long you will need to stay in the hospital after your procedure.  This information is not intended to replace advice given to you by your health care provider. Make sure you discuss any questions you have with your health care provider.  Document Released: 09/08/2013 Document Revised: 08/22/2016 Document Reviewed: 08/22/2016  Elsevier Interactive Patient Education  2017 [employer]

## 2017-04-25 NOTE — Progress Notes (Signed)
POD#1  TAH/Bilateral Salpingectomy  Subjective: Patient reports tolerating PO, + flatus and no problems voiding.    Objective: I have reviewed patient's vital signs.  vital signs, intake and output and medications.  Vitals:   04/25/17 0000 04/25/17 0400  BP: (!) 154/56 140/66  Pulse: 83 87  Resp: 16 16  Temp: 99.1 F (37.3 C) 98.6 F (37 C)   I/O last 3 completed shifts: In: 9450 [I.V.:1050] Out: 3888 [Urine:3525; Blood:200] No intake/output data recorded.   EXAM General: alert and cooperative Resp: clear to auscultation bilaterally Cardio: regular rate and rhythm GI: soft, non-tender; bowel sounds normal; no masses,  no organomegaly and incision: clean, dry and intact Extremities: no edema, redness or tenderness in the calves or thighs Vaginal Bleeding: none  Results for CORLEEN, OTWELL (MRN [id number]) as of 04/25/2017 10:34  Ref. Range 04/25/2017 08:15  WBC Latest Ref Range: 4.0 - 10.5 K/uL 9.4  RBC Latest Ref Range: 3.87 - 5.11 MIL/uL 4.01  Hemoglobin Latest Ref Range: 12.0 - 15.0 g/dL 9.4 (L)  HCT Latest Ref Range: 36.0 - 46.0 % 31.0 (L)  MCV Latest Ref Range: 78.0 - 100.0 fL 77.3 (L)  MCH Latest Ref Range: 26.0 - 34.0 pg 23.4 (L)  MCHC Latest Ref Range: 30.0 - 36.0 g/dL 30.3  RDW Latest Ref Range: 11.5 - 15.5 % 22.7 (H)  Platelets Latest Ref Range: 150 - 400 K/uL 343   Assessment: s/p Procedure(s): TOTAL ABDOMINAL HYSTERECTOMY WITH BILATERAL SALPINGECTOMY: stable and progressing well  Plan: Advance diet Encourage ambulation Advance to PO medication Discontinue IV fluids CBC ordered to do this am, Hb completely stable at 9.4. Discharge Home  LOS: 1 day    Princess Bruins, MD 04/25/2017 7:41 AM    04/25/2017, 7:41 AM

## 2017-04-25 NOTE — Discharge Summary (Signed)
Physician Discharge Summary  Patient ID: Barbara Rivera MRN: [id number] DOB/AGE: July 16, 1971 46 y.o.  Admit date: 04/24/2017 Discharge date: 04/25/2017  Admission Diagnoses: large uterine fibroids, menorrhagia, anemia   Discharge Diagnoses: same Active Problems:   Postoperative state   Discharged Condition: good  Consults:None  Significant Diagnostic Studies: None  Treatments:surgery: Total Abdominal Hysterectomy with Bilateral Salpingectomy  Vitals:   04/25/17 0400 04/25/17 0800  BP: 140/66 (!) 137/54  Pulse: 87 84  Resp: 16 16  Temp: 98.6 F (37 C) 99.9 F (37.7 C)  SpO2: 95% 97%     Total I/O In: -  Out: 1000 [Urine:1000]   Discharge Exam: Normal Post Op   Disposition: 01-Home or Self Care     Allergies as of 04/25/2017      Reactions   Betadine [povidone Iodine] Other (See Comments)   "eats off a layer of skin"   Sulfa Antibiotics Rash      Medication List    STOP taking these medications   HYDROmorphone 2 MG tablet Commonly known as:  DILAUDID   Norethindrone Acetate-Ethinyl Estradiol 1.5-30 MG-MCG tablet Commonly known as:  JUNEL,LOESTRIN,MICROGESTIN     TAKE these medications   acetaminophen 500 MG tablet Commonly known as:  TYLENOL Take 1,000 mg by mouth every 6 (six) hours as needed (for pain.).   ferrous sulfate 325 (65 FE) MG tablet Take 1 tablet (325 mg total) by mouth 3 (three) times daily with meals.   ketorolac 10 MG tablet Commonly known as:  TORADOL Take 1 tablet (10 mg total) by mouth every 6 (six) hours as needed. What changed:  reasons to take this   multivitamin with minerals Tabs tablet Take 1 tablet by mouth daily.   oxyCODONE-acetaminophen 7.5-325 MG tablet Commonly known as:  PERCOCET Take 1 tablet by mouth every 4 (four) hours as needed for severe pain.        Follow-up Information    Princess Bruins, MD In 3 weeks.   Specialty:  Obstetrics and Gynecology Contact information: [address]  [place] [address] [phone]            Signed: Princess Bruins 04/25/2017, 10:47 AM  Note: This dictation was prepared with  Dragon/digital dictation along withSmart phrase technology. Any transcriptional errors that result from this process are unintentional.

## 2017-04-26 ENCOUNTER — Encounter (HOSPITAL_COMMUNITY): Payer: Self-pay | Admitting: Obstetrics & Gynecology

## 2017-04-26 NOTE — Anesthesia Postprocedure Evaluation (Signed)
Anesthesia Post Note  Patient: Barbara Rivera  Procedure(s) Performed: Procedure(s) (LRB): TOTAL ABDOMINAL HYSTERECTOMY WITH BILATERAL SALPINGECTOMY (Bilateral)     Patient location during evaluation: PACU Anesthesia Type: General Level of consciousness: awake and alert Pain management: pain level controlled Vital Signs Assessment: post-procedure vital signs reviewed and stable Respiratory status: spontaneous breathing, nonlabored ventilation, respiratory function stable and patient connected to nasal cannula oxygen Cardiovascular status: blood pressure returned to baseline and stable Postop Assessment: no signs of nausea or vomiting Anesthetic complications: no    Last Vitals:  Vitals:   04/25/17 0800 04/25/17 1139  BP: (!) 137/54   Pulse: 84   Resp: 16   Temp: 37.7 C 37.3 C  SpO2: 97%     Last Pain:  Vitals:   04/25/17 1139  TempSrc: Oral  PainSc:                  Riccardo Dubin

## 2017-05-06 ENCOUNTER — Ambulatory Visit: Payer: BLUE CROSS/BLUE SHIELD | Admitting: Obstetrics & Gynecology

## 2017-05-16 ENCOUNTER — Encounter: Payer: Self-pay | Admitting: Obstetrics & Gynecology

## 2017-05-16 ENCOUNTER — Ambulatory Visit (INDEPENDENT_AMBULATORY_CARE_PROVIDER_SITE_OTHER): Payer: BLUE CROSS/BLUE SHIELD | Admitting: Obstetrics & Gynecology

## 2017-05-16 VITALS — BP 132/82

## 2017-05-16 DIAGNOSIS — Z09 Encounter for follow-up examination after completed treatment for conditions other than malignant neoplasm: Secondary | ICD-10-CM

## 2017-05-16 NOTE — Patient Instructions (Signed)
1. Follow-up examination after gynecological surgery Good postop healing.  No complication.  Very satisfied.  Will return to work at 6 weeks postop.  Can resume sexual activity at 6 weeks postop.  F/U Annual/Gyn exam in 1 year.  Patience, it was good to see you so well today!

## 2017-05-16 NOTE — Progress Notes (Signed)
    TONJI ELLIFF 1971/03/26 [id number]        46 y.o.  G3P3   RP:  Post op TAH/Bilateral Salpingectomy 04/24/2017  HPI:  Very good postop healing.  No abdominopelvic pain.  Incision well closed, not painful.  No vaginal bleeding.  No fever.  Mictions/BMs wnl.  Past medical history,surgical history, problem list, medications, allergies, family history and social history were all reviewed and documented in the EPIC chart.  Directed ROS with pertinent positives and negatives documented in the history of present illness/assessment and plan.  Exam:  Vitals:   05/16/17 1015  BP: 132/82   General appearance:  Normal  Abdo: Soft, NT, Incision well healed, no erythema, no dehiscence.  Gyn exam:  Vulva normal                      Speculum:  Vaginal vault healing well.  Secretions normal, no bleeding.  Bimanual exam: vaginal vault intact, no mass, NT.  Assessment/Plan:  46 y.o. G3P3   1. Follow-up examination after gynecological surgery Good postop healing.  No complication.  Very satisfied.  Will return to work at 6 weeks postop.  Can resume sexual activity at 6 weeks postop.  F/U Annual/Gyn exam in 1 year.  Princess Bruins MD, 10:31 AM 05/16/2017

## 2022-05-18 ENCOUNTER — Encounter (HOSPITAL_BASED_OUTPATIENT_CLINIC_OR_DEPARTMENT_OTHER): Payer: Self-pay | Admitting: Emergency Medicine

## 2022-05-18 ENCOUNTER — Emergency Department (HOSPITAL_BASED_OUTPATIENT_CLINIC_OR_DEPARTMENT_OTHER)
Admission: EM | Admit: 2022-05-18 | Discharge: 2022-05-19 | Disposition: A | Discharge status: Home / Self Care | Payer: BC Managed Care – PPO | Attending: Emergency Medicine | Admitting: Emergency Medicine

## 2022-05-18 ENCOUNTER — Emergency Department (HOSPITAL_BASED_OUTPATIENT_CLINIC_OR_DEPARTMENT_OTHER): Payer: BC Managed Care – PPO

## 2022-05-18 DIAGNOSIS — I10 Essential (primary) hypertension: Secondary | ICD-10-CM | POA: Diagnosis not present

## 2022-05-18 DIAGNOSIS — R0789 Other chest pain: Secondary | ICD-10-CM

## 2022-05-18 LAB — CBC WITH DIFFERENTIAL/PLATELET
Abs Immature Granulocytes: 0.02 10*3/uL (ref 0.00–0.07)
Basophils Absolute: 0 10*3/uL (ref 0.0–0.1)
Basophils Relative: 0 %
Eosinophils Absolute: 0.2 10*3/uL (ref 0.0–0.5)
Eosinophils Relative: 2 %
HCT: 36.5 % (ref 36.0–46.0)
Hemoglobin: 12 g/dL (ref 12.0–15.0)
Immature Granulocytes: 0 %
Lymphocytes Relative: 34 %
Lymphs Abs: 3.2 10*3/uL (ref 0.7–4.0)
MCH: 28 pg (ref 26.0–34.0)
MCHC: 32.9 g/dL (ref 30.0–36.0)
MCV: 85.1 fL (ref 80.0–100.0)
Monocytes Absolute: 0.5 10*3/uL (ref 0.1–1.0)
Monocytes Relative: 5 %
Neutro Abs: 5.6 10*3/uL (ref 1.7–7.7)
Neutrophils Relative %: 59 %
Platelets: 359 10*3/uL (ref 150–400)
RBC: 4.29 MIL/uL (ref 3.87–5.11)
RDW: 12.2 % (ref 11.5–15.5)
WBC: 9.5 10*3/uL (ref 4.0–10.5)
nRBC: 0 % (ref 0.0–0.2)

## 2022-05-18 NOTE — ED Triage Notes (Signed)
Pt states she has a hx of hypertension and just recently started a stressful job  Pt states she started having chest pain, shortness of breath, headaches, dizziness last weekend  Pt states the sxs come and go  Pt denies any sxs at this time  Pt states they called 911 earlier and the EKG showed a right bundle branch block which pt states she has no hx of so she was told to come in and get evaluated

## 2022-05-18 NOTE — ED Provider Notes (Signed)
[facility] Provider Note   CSN: [id number] Arrival date & time: 05/18/22  2224     History  Chief Complaint  Patient presents with   Hypertension    Barbara Rivera is a 51 y.o. female.  Patient is a 51 year old female with past medical history of prediabetes.  Patient presenting today for evaluation of elevated blood pressure, fatigue, and feeling generally unwell for the past 2 weeks.  She began having occasional tightness in her chest over the past few days, then went to her primary doctor to be seen.  She was told she had a right bundle branch block, then was referred here for further evaluation.  She denies chest pain at present.  She denies exertional symptoms.  She does report some increased stress at work recently.  The history is provided by the patient.       Home Medications Prior to Admission medications   Medication Sig Start Date End Date Taking? Authorizing Provider  acetaminophen (TYLENOL) 500 MG tablet Take 1,000 mg by mouth every 6 (six) hours as needed (for pain.).    [provider]  ferrous sulfate 325 (65 FE) MG tablet Take 1 tablet (325 mg total) by mouth 3 (three) times daily with meals. 12/06/15   Molpus, John, MD  Multiple Vitamin (MULTIVITAMIN WITH MINERALS) TABS tablet Take 1 tablet by mouth daily.    [provider]  Norethindrone Acetate-Ethinyl Estrad-FE (LOESTRIN 24 FE) 1-20 MG-MCG(24) tablet Take 1 tablet by mouth daily. Continuous use for menorrhagia 03/11/17 04/11/17  Princess Bruins, MD      Allergies    Betadine [povidone iodine] and Sulfa antibiotics    Review of Systems   Review of Systems  All other systems reviewed and are negative.   Physical Exam Updated Vital Signs BP (!) 172/79 (BP Location: Right Arm)   Pulse 64   Temp 98.1 F (36.7 C) (Oral)   Resp 17   Ht 5' 3.5" (1.613 m)   Wt 95.3 kg   LMP 04/02/2017   SpO2 100%   BMI 36.62 kg/m  Physical Exam Vitals and nursing  note reviewed.  Constitutional:      General: She is not in acute distress.    Appearance: She is well-developed. She is not diaphoretic.  HENT:     Head: Normocephalic and atraumatic.  Cardiovascular:     Rate and Rhythm: Normal rate and regular rhythm.     Heart sounds: No murmur heard.    No friction rub. No gallop.  Pulmonary:     Effort: Pulmonary effort is normal. No respiratory distress.     Breath sounds: Normal breath sounds. No wheezing.  Abdominal:     General: Bowel sounds are normal. There is no distension.     Palpations: Abdomen is soft.     Tenderness: There is no abdominal tenderness.  Musculoskeletal:        General: Normal range of motion.     Cervical back: Normal range of motion and neck supple.  Skin:    General: Skin is warm and dry.  Neurological:     General: No focal deficit present.     Mental Status: She is alert and oriented to person, place, and time.     ED Results / Procedures / Treatments   Labs (all labs ordered are listed, but only abnormal results are displayed) Labs Reviewed  COMPREHENSIVE METABOLIC PANEL  CBC WITH DIFFERENTIAL/PLATELET  TROPONIN I (HIGH SENSITIVITY)    EKG None  Radiology No results found.  Procedures Procedures  {Document cardiac monitor, telemetry assessment procedure when appropriate:1}  Medications Ordered in ED Medications - No data to display  ED Course/ Medical Decision Making/ A&P                           Medical Decision Making Amount and/or Complexity of Data Reviewed Labs: ordered. Radiology: ordered.   ***  {Document critical care time when appropriate:1} {Document review of labs and clinical decision tools ie heart score, Chads2Vasc2 etc:1}  {Document your independent review of radiology images, and any outside records:1} {Document your discussion with family members, caretakers, and with consultants:1} {Document social determinants of health affecting pt's care:1} {Document your  decision making why or why not admission, treatments were needed:1} Final Clinical Impression(s) / ED Diagnoses Final diagnoses:  None    Rx / DC Orders ED Discharge Orders     None

## 2022-05-19 LAB — COMPREHENSIVE METABOLIC PANEL
ALT: 13 U/L (ref 0–44)
AST: 16 U/L (ref 15–41)
Albumin: 3.8 g/dL (ref 3.5–5.0)
Alkaline Phosphatase: 63 U/L (ref 38–126)
Anion gap: 7 (ref 5–15)
BUN: 11 mg/dL (ref 6–20)
CO2: 24 mmol/L (ref 22–32)
Calcium: 8.6 mg/dL — ABNORMAL LOW (ref 8.9–10.3)
Chloride: 107 mmol/L (ref 98–111)
Creatinine, Ser: 0.72 mg/dL (ref 0.44–1.00)
GFR, Estimated: >60 mL/min (ref >60)
Glucose, Bld: 115 mg/dL — ABNORMAL HIGH (ref 70–99)
Potassium: 3.8 mmol/L (ref 3.5–5.1)
Sodium: 138 mmol/L (ref 135–145)
Total Bilirubin: 0.6 mg/dL (ref 0.3–1.2)
Total Protein: 7.1 g/dL (ref 6.5–8.1)

## 2022-05-19 LAB — TROPONIN I (HIGH SENSITIVITY): Troponin I (High Sensitivity): 4 ng/L (ref <18)

## 2022-05-19 NOTE — Discharge Instructions (Signed)
Rest.  You have a record of your blood pressures at home and follow this up next week with your primary doctor.  Return to the emergency department if you develop severe chest pain, difficulty breathing, or for other new and concerning symptoms.

## 2023-12-26 ENCOUNTER — Ambulatory Visit
Admission: EM | Admit: 2023-12-26 | Discharge: 2023-12-26 | Disposition: A | Discharge status: Home / Self Care | Attending: Physician Assistant | Admitting: Physician Assistant

## 2023-12-26 ENCOUNTER — Other Ambulatory Visit: Payer: Self-pay

## 2023-12-26 DIAGNOSIS — N390 Urinary tract infection, site not specified: Secondary | ICD-10-CM | POA: Insufficient documentation

## 2023-12-26 DIAGNOSIS — R300 Dysuria: Secondary | ICD-10-CM | POA: Diagnosis present

## 2023-12-26 DIAGNOSIS — R319 Hematuria, unspecified: Secondary | ICD-10-CM | POA: Diagnosis not present

## 2023-12-26 LAB — POCT URINALYSIS DIP (MANUAL ENTRY)
Bilirubin, UA: NEGATIVE
Glucose, UA: NEGATIVE mg/dL
Ketones, POC UA: NEGATIVE mg/dL
Nitrite, UA: NEGATIVE
Protein Ur, POC: 30 mg/dL — AB
Spec Grav, UA: 1.02 (ref 1.010–1.025)
Urobilinogen, UA: 0.2 U/dL
pH, UA: 7 (ref 5.0–8.0)

## 2023-12-26 MED ORDER — PHENAZOPYRIDINE HCL 100 MG PO TABS: 100.0000 mg | ORAL_TABLET | Freq: Three times a day (TID) | ORAL | 0 refills | Status: AC | PRN

## 2023-12-26 MED ORDER — NITROFURANTOIN MONOHYD MACRO 100 MG PO CAPS
100.0000 mg | ORAL_CAPSULE | Freq: Two times a day (BID) | ORAL | 0 refills | Status: AC
Start: 2023-12-26 — End: 2023-12-31

## 2023-12-26 NOTE — ED Provider Notes (Signed)
 [facility]    CSN: [id number] Arrival date & time: 12/26/23  1813      History   Chief Complaint Chief Complaint  Patient presents with   Urinary Frequency    HPI JAMELAH SITZER is a 53 y.o. female.   HPI  She reports concerns for UTI She states she has had dysuria, bladder spasms, increased urinary frequency for about 4 days  She states she has been taking [place] to assist with symptom management   She states she has a previous hx of recurrent UTIs but has not had one in almost 2 years due to lifestyle interventions    Past Medical History:  Diagnosis Date   Anemia    Bronchitis 2008   Dysmenorrhea    Fibroid tumor    Headache    after stopped pain medicine    Patient Active Problem List   Diagnosis Date Noted   Postoperative state 04/24/2017   Iron deficiency anemia due to chronic blood loss 04/15/2017    Past Surgical History:  Procedure Laterality Date   CERVICAL CERCLAGE     CESAREAN SECTION     HYSTERECTOMY ABDOMINAL WITH SALPINGECTOMY Bilateral 04/24/2017   Procedure: TOTAL ABDOMINAL HYSTERECTOMY WITH BILATERAL SALPINGECTOMY;  Surgeon: Genia Del, MD;  Location: [facility];  Service: Gynecology;  Laterality: Bilateral;    OB History     Gravida  3   Para  3   Term      Preterm      AB      Living  3      SAB      IAB      Ectopic      Multiple      Live Births               Home Medications    Prior to Admission medications   Medication Sig Start Date End Date Taking? Authorizing Provider  atorvastatin (LIPITOR) 20 MG tablet Take 20 mg by mouth daily. 11/08/22  Yes [provider]  nitrofurantoin, macrocrystal-monohydrate, (MACROBID) 100 MG capsule Take 1 capsule (100 mg total) by mouth 2 (two) times daily for 5 days. 12/26/23 12/31/23 Yes Chester Romero E, PA-C  phenazopyridine (PYRIDIUM) 100 MG tablet Take 1 tablet (100 mg total) by mouth 3 (three) times daily as needed for pain. 12/26/23  Yes Lulani Bour,  Emiyah Spraggins E, PA-C  acetaminophen (TYLENOL) 500 MG tablet Take 1,000 mg by mouth every 6 (six) hours as needed (for pain.).    [provider]  ferrous sulfate 325 (65 FE) MG tablet Take 1 tablet (325 mg total) by mouth 3 (three) times daily with meals. 12/06/15   Molpus, John, MD  MOUNJARO 7.5 MG/0.5ML Pen Inject 7.5 mg into the skin once a week.    [provider]  Multiple Vitamin (MULTIVITAMIN WITH MINERALS) TABS tablet Take 1 tablet by mouth daily.    [provider]  Norethindrone Acetate-Ethinyl Estrad-FE (LOESTRIN 24 FE) 1-20 MG-MCG(24) tablet Take 1 tablet by mouth daily. Continuous use for menorrhagia 03/11/17 04/11/17  Genia Del, MD    Family History Family History  Problem Relation Age of Onset   Hypertension Mother    Diabetes Maternal Grandfather    Hypertension Maternal Grandfather    Heart attack Paternal Grandmother    Stroke Paternal Grandmother    Heart attack Paternal Grandfather     Social History Social History   Tobacco Use   Smoking status: Never   Smokeless tobacco: Never  Vaping Use   Vaping status: Never Used  Substance Use Topics   Alcohol use: Yes    Alcohol/week: 1.0 standard drink of alcohol    Types: 1 Glasses of wine per week    Comment: rare   Drug use: No     Allergies   Betadine [povidone iodine] and Sulfa antibiotics   Review of Systems Review of Systems  Constitutional:  Negative for chills and fever.  Gastrointestinal:  Negative for abdominal pain.  Genitourinary:  Positive for dysuria, frequency and hematuria. Negative for flank pain, vaginal bleeding, vaginal discharge and vaginal pain.     Physical Exam Triage Vital Signs ED Triage Vitals  Encounter Vitals Group     BP 12/26/23 1831 138/85     Systolic BP Percentile --      Diastolic BP Percentile --      Pulse Rate 12/26/23 1831 61     Resp 12/26/23 1831 18     Temp 12/26/23 1831 98 F (36.7 C)     Temp Source 12/26/23 1831 Oral     SpO2  12/26/23 1831 96 %     Weight 12/26/23 1829 190 lb (86.2 kg)     Height 12/26/23 1829 5\' 3"  (1.6 m)     Head Circumference --      Peak Flow --      Pain Score 12/26/23 1829 0     Pain Loc --      Pain Education --      Exclude from Growth Chart --    No data found.  Updated Vital Signs BP 138/85 (BP Location: Left Arm)   Pulse 61   Temp 98 F (36.7 C) (Oral)   Resp 18   Ht 5\' 3"  (1.6 m)   Wt 190 lb (86.2 kg)   LMP 04/02/2017   SpO2 96%   BMI 33.66 kg/m   Visual Acuity Right Eye Distance:   Left Eye Distance:   Bilateral Distance:    Right Eye Near:   Left Eye Near:    Bilateral Near:     Physical Exam Vitals reviewed.  Constitutional:      General: She is awake.     Appearance: Normal appearance. She is well-developed and well-groomed.  HENT:     Head: Normocephalic and atraumatic.  Eyes:     General: Lids are normal. Gaze aligned appropriately.     Extraocular Movements: Extraocular movements intact.     Conjunctiva/sclera: Conjunctivae normal.  Pulmonary:     Effort: Pulmonary effort is normal.  Neurological:     General: No focal deficit present.     Mental Status: She is alert and oriented to person, place, and time.     GCS: GCS eye subscore is 4. GCS verbal subscore is 5. GCS motor subscore is 6.     Cranial Nerves: No cranial nerve deficit, dysarthria or facial asymmetry.  Psychiatric:        Attention and Perception: Attention and perception normal.        Mood and Affect: Mood and affect normal.        Speech: Speech normal.        Behavior: Behavior normal. Behavior is cooperative.      UC Treatments / Results  Labs (all labs ordered are listed, but only abnormal results are displayed) Labs Reviewed  POCT URINALYSIS DIP (MANUAL ENTRY) - Abnormal; Notable for the following components:      Result Value   Clarity, UA turbid (*)  Blood, UA trace-intact (*)    Protein Ur, POC =30 (*)    Leukocytes, UA Large (3+) (*)    All other  components within normal limits  URINE CULTURE    EKG   Radiology No results found.  Procedures Procedures (including critical care time)  Medications Ordered in UC Medications - No data to display  Initial Impression / Assessment and Plan / UC Course  I have reviewed the triage vital signs and the nursing notes.  Pertinent labs & imaging results that were available during my care of the patient were reviewed by me and considered in my medical decision making (see chart for details).      Final Clinical Impressions(s) / UC Diagnoses   Final diagnoses:  Dysuria  Urinary tract infection with hematuria, site unspecified    Acute, new problem Patient reports symptoms comprised of the following: dysuria, pressure, increased urinary frequency for the past 4 days  Results of UA are consistent with UTI - urine sample sent for culture to determine causative organism and susceptibility- results to dictate further management  Recommend starting macrobid 100 mg PO BID x 5 days  Will send in Pyridium 100 mg PO TID PRN to assist with discomfort  Reviewed  importance of finishing entire course of abx and staying well hydrated while recovering from UTI Reviewed ED precautions with patient and provided theses in AVS  Follow up as needed for persistent or worsening symptoms    Discharge Instructions      Based on your symptoms and results of the urinalysis I believe you have a UTI I recommend the following:   I have sent in a script for Macrobid 100 mg to be taken by mouth twice per day for 5 days   Please finish the entire course of the antibiotic even if you are feeling better before it is completed unless instructed otherwise by a healthcare provider or if you develop an allergic reaction   Stay well hydrated (at least 75 oz of water per day) and avoid holding your urine for prolonged periods of time   We will keep you updated on the results of your urine culture and if any  changes need to be made to your regimen based on those results.   If you have any of the following please return to urgent care or go to the ED : persistent symptoms, fever, trouble urinating or inability to urinate, confusion, flank pain.       ED Prescriptions     Medication Sig Dispense Auth. Provider   nitrofurantoin, macrocrystal-monohydrate, (MACROBID) 100 MG capsule Take 1 capsule (100 mg total) by mouth 2 (two) times daily for 5 days. 10 capsule Malina Geers E, PA-C   phenazopyridine (PYRIDIUM) 100 MG tablet Take 1 tablet (100 mg total) by mouth 3 (three) times daily as needed for pain. 10 tablet Ilia Engelbert E, PA-C      PDMP not reviewed this encounter.   Roselind Messier 12/26/23 1921

## 2023-12-26 NOTE — Discharge Instructions (Addendum)
 Based on your symptoms and results of the urinalysis I believe you have a UTI I recommend the following:   I have sent in a script for Macrobid 100 mg to be taken by mouth twice per day for 5 days   Please finish the entire course of the antibiotic even if you are feeling better before it is completed unless instructed otherwise by a healthcare provider or if you develop an allergic reaction   Stay well hydrated (at least 75 oz of water per day) and avoid holding your urine for prolonged periods of time   We will keep you updated on the results of your urine culture and if any changes need to be made to your regimen based on those results.   If you have any of the following please return to urgent care or go to the ED : persistent symptoms, fever, trouble urinating or inability to urinate, confusion, flank pain.

## 2023-12-26 NOTE — ED Triage Notes (Addendum)
 Pt presents with complaints of urinary frequency and pain/burning with urination x 4 days. Pt currently denies pain. Only pain when urinating. OTC Uqora taken with some relief. Denies fevers at home.

## 2023-12-28 LAB — URINE CULTURE: Culture: 20000 — AB
